# Patient Record
Sex: Female | Born: 1985 | Race: White | Hispanic: No | Marital: Married | Smoking: Never smoker
Health system: Southern US, Community
[De-identification: ages and names within clinical notes are randomized; demographics above are authoritative.]

## PROBLEM LIST (undated history)

## (undated) DIAGNOSIS — Z789 Other specified health status: Secondary | ICD-10-CM

## (undated) HISTORY — PX: TYMPANOSTOMY TUBE PLACEMENT: SHX32

## (undated) HISTORY — PX: WISDOM TOOTH EXTRACTION: SHX21

---

## 2018-03-22 NOTE — L&D Delivery Note (Signed)
Delivery Note:  G1P0 at [redacted]w[redacted]d  Admitting diagnosis: IOL for Gestational HTN Risks: Gestational HTN, Obesity Onset of labor: 12/08/2018 @ 1300 Augmentation: AROM, Pitocin, Cytotec and Foley Balloon ROM: 1252  Complete dilation at 12/08/2018  @ 1952 Onset of pushing at 1954 FHR second stage Cat 2 - Repetitive deep variables with late recovery. Patient pushed with every other ctx in lateral positions. Moderate variability throughout.  Analgesia /Anesthesia intrapartum:Epidural  Pushing in right and left lateral  Spouse present for birth and supportive  Delivery of a Live born female  Birth Weight:   APGAR: 71, 9  Newborn Delivery   Birth date/time: 12/08/2018 20:40:00 Delivery type: Vaginal, Spontaneous      in vertex presentation, position OA to ROT.  Nuchal Cord: Yes - baby somersaulted onto perineum for Nuchal x1 Cord double clamped after cessation of pulsation, cut by FOB.  Collection of cord blood for typing completed. Cord blood donation-None  Arterial cord blood sample-  None  Placenta delivered-Spontaneous  with  gentle traction, LUS massage and maternal effort. Small placental plate with eccentric cord insertion, complete and intact. Uterotonics: Pitocin bolus post placenta Placenta to L&D for disposal. Uterine tone firm, bleeding light.  1st degree  laceration and right labial splay identified.  Episiotomy:None  Local analgesia: None  Repair: 3-0 Vicryl Rapide, 4-0 Vicryl. Perineal repair with standard fashion and good hemostasis.  Est. Blood Loss (mL): 212 Complications: None  APGAR:1 min-8 , 5 min-9  10 min-   Mom to postpartum.  Baby to Couplet care / Skin to Skin.  Repeat PEC labs in AM.  Delivery Report:  Review the Delivery Report for details.     Signed: Juanna Cao, SNM, BSN 12/08/2018, 9:25 PM

## 2018-07-11 LAB — OB RESULTS CONSOLE HEPATITIS B SURFACE ANTIGEN: Hepatitis B Surface Ag: NEGATIVE

## 2018-07-11 LAB — OB RESULTS CONSOLE RUBELLA ANTIBODY, IGM: Rubella: IMMUNE

## 2018-07-11 LAB — OB RESULTS CONSOLE HIV ANTIBODY (ROUTINE TESTING): HIV: NONREACTIVE

## 2018-07-14 LAB — OB RESULTS CONSOLE GC/CHLAMYDIA
Chlamydia: NEGATIVE
Gonorrhea: NEGATIVE

## 2018-09-30 ENCOUNTER — Inpatient Hospital Stay (HOSPITAL_COMMUNITY)
Admission: AD | Admit: 2018-09-30 | Discharge: 2018-10-01 | Disposition: A | Discharge status: Home / Self Care | Payer: 59 | Attending: Obstetrics and Gynecology | Admitting: Obstetrics and Gynecology

## 2018-09-30 ENCOUNTER — Encounter (HOSPITAL_COMMUNITY): Payer: Self-pay

## 2018-09-30 ENCOUNTER — Inpatient Hospital Stay (HOSPITAL_COMMUNITY): Payer: 59

## 2018-09-30 DIAGNOSIS — R109 Unspecified abdominal pain: Secondary | ICD-10-CM

## 2018-09-30 DIAGNOSIS — N200 Calculus of kidney: Secondary | ICD-10-CM | POA: Insufficient documentation

## 2018-09-30 DIAGNOSIS — O26892 Other specified pregnancy related conditions, second trimester: Secondary | ICD-10-CM | POA: Diagnosis not present

## 2018-09-30 DIAGNOSIS — Z3A27 27 weeks gestation of pregnancy: Secondary | ICD-10-CM

## 2018-09-30 DIAGNOSIS — O26833 Pregnancy related renal disease, third trimester: Secondary | ICD-10-CM

## 2018-09-30 DIAGNOSIS — M549 Dorsalgia, unspecified: Secondary | ICD-10-CM | POA: Diagnosis present

## 2018-09-30 DIAGNOSIS — O26832 Pregnancy related renal disease, second trimester: Secondary | ICD-10-CM | POA: Insufficient documentation

## 2018-09-30 HISTORY — DX: Other specified health status: Z78.9

## 2018-09-30 LAB — CBC WITH DIFFERENTIAL/PLATELET
Abs Immature Granulocytes: 0.12 10*3/uL — ABNORMAL HIGH (ref 0.00–0.07)
Basophils Absolute: 0 10*3/uL (ref 0.0–0.1)
Basophils Relative: 0 %
Eosinophils Absolute: 0 10*3/uL (ref 0.0–0.5)
Eosinophils Relative: 0 %
HCT: 38.5 % (ref 36.0–46.0)
Hemoglobin: 13.1 g/dL (ref 12.0–15.0)
Immature Granulocytes: 1 %
Lymphocytes Relative: 11 %
Lymphs Abs: 1.8 10*3/uL (ref 0.7–4.0)
MCH: 31 pg (ref 26.0–34.0)
MCHC: 34 g/dL (ref 30.0–36.0)
MCV: 91.2 fL (ref 80.0–100.0)
Monocytes Absolute: 0.9 10*3/uL (ref 0.1–1.0)
Monocytes Relative: 5 %
Neutro Abs: 13.6 10*3/uL — ABNORMAL HIGH (ref 1.7–7.7)
Neutrophils Relative %: 83 %
Platelets: 262 10*3/uL (ref 150–400)
RBC: 4.22 MIL/uL (ref 3.87–5.11)
RDW: 12 % (ref 11.5–15.5)
WBC: 16.4 10*3/uL — ABNORMAL HIGH (ref 4.0–10.5)
nRBC: 0 % (ref 0.0–0.2)

## 2018-09-30 LAB — COMPREHENSIVE METABOLIC PANEL
ALT: 14 U/L (ref 0–44)
AST: 33 U/L (ref 15–41)
Albumin: 3.2 g/dL — ABNORMAL LOW (ref 3.5–5.0)
Alkaline Phosphatase: 50 U/L (ref 38–126)
Anion gap: 13 (ref 5–15)
BUN: 9 mg/dL (ref 6–20)
CO2: 17 mmol/L — ABNORMAL LOW (ref 22–32)
Calcium: 9.1 mg/dL (ref 8.9–10.3)
Chloride: 104 mmol/L (ref 98–111)
Creatinine, Ser: 0.94 mg/dL (ref 0.44–1.00)
GFR calc Af Amer: >60 mL/min (ref >60)
GFR calc non Af Amer: >60 mL/min (ref >60)
Glucose, Bld: 106 mg/dL — ABNORMAL HIGH (ref 70–99)
Potassium: 4.5 mmol/L (ref 3.5–5.1)
Sodium: 134 mmol/L — ABNORMAL LOW (ref 135–145)
Total Bilirubin: 1.1 mg/dL (ref 0.3–1.2)
Total Protein: 6 g/dL — ABNORMAL LOW (ref 6.5–8.1)

## 2018-09-30 MED ORDER — LACTATED RINGERS IV BOLUS
1000.0000 mL | Freq: Once | INTRAVENOUS | Status: AC
  Administered 2018-09-30: 1000 mL via INTRAVENOUS

## 2018-09-30 MED ORDER — HYDROMORPHONE HCL 1 MG/ML IJ SOLN
1.0000 mg | Freq: Once | INTRAMUSCULAR | Status: AC
  Administered 2018-09-30: 1 mg via INTRAVENOUS
  Filled 2018-09-30: qty 1

## 2018-09-30 MED ORDER — ONDANSETRON HCL 4 MG/2ML IJ SOLN
4.0000 mg | Freq: Once | INTRAMUSCULAR | Status: AC
  Administered 2018-09-30: 4 mg via INTRAVENOUS
  Filled 2018-09-30: qty 2

## 2018-09-30 NOTE — MAU Provider Note (Signed)
History     CSN: [id number]  Arrival date and time: 09/30/18 2102   First Provider Initiated Contact with Patient 09/30/18 2132      Chief Complaint  Patient presents with  . Back Pain   Christine Rivas is a 33 y.o. G1P0 at 3015w2d who presents today with back pain. She was seen by her OB on 09/29/2018 and was diagnosed with a UTI. She was started on an antibiotic, but around 0200 today she had severe back pain. She has been vomiting due to the pain. She denies any contractions, VB or  LOF. She reports normal fetal movement. Next OB visit: 10/16/2018  Back Pain This is a new problem. The current episode started today. The problem occurs constantly. The problem has been gradually worsening since onset. Pain location: Right Flank  The pain is at a severity of 10/10. Pertinent negatives include no dysuria or fever. Risk factors include pregnancy. Treatments tried: tylenol PM around 0200. The treatment provided no relief.    OB History    Gravida  1   Para      Term      Preterm      AB      Living        SAB      TAB      Ectopic      Multiple      Live Births              Past Medical History:  Diagnosis Date  . Medical history non-contributory     Past Surgical History:  Procedure Laterality Date  . WISDOM TOOTH EXTRACTION      Family History  Problem Relation Age of Onset  . Hypertension Father     Social History   Tobacco Use  . Smoking status: Never Smoker  . Smokeless tobacco: Never Used  Substance Use Topics  . Alcohol use: Not Currently  . Drug use: Not Currently    Allergies: No Known Allergies  Medications Prior to Admission  Medication Sig Dispense Refill Last Dose  . Multiple Vitamin (MULTIVITAMIN) tablet Take 1 tablet by mouth daily.     . nitrofurantoin, macrocrystal-monohydrate, (MACROBID) 100 MG capsule Take 100 mg by mouth 2 (two) times daily.       Review of Systems  Constitutional: Negative for diaphoresis and fever.   Gastrointestinal: Positive for nausea and vomiting.  Genitourinary: Positive for flank pain. Negative for dysuria, hematuria, vaginal bleeding and vaginal discharge.  Musculoskeletal: Positive for back pain.   Physical Exam   Blood pressure (!) 123/96, pulse 96, temperature 98.3 F (36.8 C), resp. rate 20, weight 88.5 kg, SpO2 100 %.  Physical Exam  Nursing note and vitals reviewed. Constitutional: She is oriented to person, place, and time. She appears well-developed and well-nourished. No distress.  HENT:  Head: Normocephalic.  Cardiovascular: Normal rate.  Respiratory: Effort normal.  GI: Soft. There is no abdominal tenderness. There is no rebound.  Neurological: She is alert and oriented to person, place, and time.  Skin: Skin is warm and dry.  Psychiatric: She has a normal mood and affect.     NST:  Baseline: 135 Variability: moderate Accels: 10x10 Decels: none Toco: none Appropriate for GA  Results for orders placed or performed during the hospital encounter of 09/30/18 (from the past 24 hour(s))  CBC with Differential/Platelet     Status: Abnormal   Collection Time: 09/30/18 10:15 PM  Result Value Ref Range   WBC 16.4 (  H) 4.0 - 10.5 K/uL   RBC 4.22 3.87 - 5.11 MIL/uL   Hemoglobin 13.1 12.0 - 15.0 g/dL   HCT 38.5 36.0 - 46.0 %   MCV 91.2 80.0 - 100.0 fL   MCH 31.0 26.0 - 34.0 pg   MCHC 34.0 30.0 - 36.0 g/dL   RDW 12.0 11.5 - 15.5 %   Platelets 262 150 - 400 K/uL   nRBC 0.0 0.0 - 0.2 %   Neutrophils Relative % 83 %   Neutro Abs 13.6 (H) 1.7 - 7.7 K/uL   Lymphocytes Relative 11 %   Lymphs Abs 1.8 0.7 - 4.0 K/uL   Monocytes Relative 5 %   Monocytes Absolute 0.9 0.1 - 1.0 K/uL   Eosinophils Relative 0 %   Eosinophils Absolute 0.0 0.0 - 0.5 K/uL   Basophils Relative 0 %   Basophils Absolute 0.0 0.0 - 0.1 K/uL   Immature Granulocytes 1 %   Abs Immature Granulocytes 0.12 (H) 0.00 - 0.07 K/uL  Comprehensive metabolic panel     Status: Abnormal   Collection  Time: 09/30/18 10:15 PM  Result Value Ref Range   Sodium 134 (L) 135 - 145 mmol/L   Potassium 4.5 3.5 - 5.1 mmol/L   Chloride 104 98 - 111 mmol/L   CO2 17 (L) 22 - 32 mmol/L   Glucose, Bld 106 (H) 70 - 99 mg/dL   BUN 9 6 - 20 mg/dL   Creatinine, Ser 0.94 0.44 - 1.00 mg/dL   Calcium 9.1 8.9 - 10.3 mg/dL   Total Protein 6.0 (L) 6.5 - 8.1 g/dL   Albumin 3.2 (L) 3.5 - 5.0 g/dL   AST 33 15 - 41 U/L   ALT 14 0 - 44 U/L   Alkaline Phosphatase 50 38 - 126 U/L   Total Bilirubin 1.1 0.3 - 1.2 mg/dL   GFR calc non Af Amer >60 >60 mL/min   GFR calc Af Amer >60 >60 mL/min   Anion gap 13 5 - 15   US Renal  Result Date: 09/30/2018 CLINICAL DATA:  Initial evaluation for acute right flank pain, [redacted] weeks pregnant. EXAM: RENAL / URINARY TRACT ULTRASOUND COMPLETE COMPARISON:  None. FINDINGS: Right Kidney: Renal measurements: 11.4 x 5.6 x 6.6 cm = volume: 222.9 mL. Echogenicity within normal limits. 4 mm nonobstructive stone present at the lower pole. Moderate right hydronephrosis, which persisted postvoid. No discrete renal lesion. Left Kidney: Renal measurements: 11.2 x 5.0 x 5.5 cm = volume: 160.1 mL. Echogenicity within normal limits. No discrete renal lesion. 5 mm nonobstructive calculus at the lower pole. No hydronephrosis. Bladder: Appears normal for degree of bladder distention. A left ureteral jet was visualized. No right ureteral jet visualized. IMPRESSION: 1. Moderate right hydronephrosis. 2. Bilateral nonobstructive nephrolithiasis as above. Electronically Signed   By: Jeannine Boga M.D.   On: 09/30/2018 23:51     MAU Course  Procedures  MDM Patient has had IV pain meds and fluids. She reports that her pain has improved.  [place] confirms kidney stones   Assessment and Plan   1. Kidney stone complicating pregnancy, third trimester   2. Right flank pain   3. [redacted] weeks gestation of pregnancy    DC home Comfort measures reviewed  3rd Trimester precautions  PTL precautions  Fetal kick  counts RX: flomax 0.4mg  QD, Zofran PRN and dilaudid PRN, continue abx already prescribed  Return to MAU as needed FU with OB as planned  Follow-up Information    Brien Few, MD Follow  up.   Specialty: Obstetrics and Gynecology Contact information: [address] [place] [address] [phone]          Thressa ShellerHeather Hogan DNP, CNM  10/01/18  12:45 AM

## 2018-09-30 NOTE — MAU Note (Addendum)
Reports she is still being treated for a UTI-at 0200 started having severe right flank pain, thinks it's a kidney stone-no hx.  No VB.  No CTX.  + FM.  Took a tylenol PM last night with no relief.

## 2018-09-30 NOTE — MAU Note (Signed)
Patient states she's unable to void at this time.  CNM notified.

## 2018-10-01 DIAGNOSIS — O26832 Pregnancy related renal disease, second trimester: Secondary | ICD-10-CM | POA: Diagnosis not present

## 2018-10-01 DIAGNOSIS — O26892 Other specified pregnancy related conditions, second trimester: Secondary | ICD-10-CM

## 2018-10-01 DIAGNOSIS — R109 Unspecified abdominal pain: Secondary | ICD-10-CM

## 2018-10-01 DIAGNOSIS — N200 Calculus of kidney: Secondary | ICD-10-CM

## 2018-10-01 DIAGNOSIS — Z3A27 27 weeks gestation of pregnancy: Secondary | ICD-10-CM

## 2018-10-01 LAB — URINALYSIS, ROUTINE W REFLEX MICROSCOPIC
Bacteria, UA: NONE SEEN
Bilirubin Urine: NEGATIVE
Glucose, UA: NEGATIVE mg/dL
Hgb urine dipstick: NEGATIVE
Ketones, ur: 80 mg/dL — AB
Nitrite: NEGATIVE
Protein, ur: NEGATIVE mg/dL
Specific Gravity, Urine: 1.02 (ref 1.005–1.030)
pH: 6 (ref 5.0–8.0)

## 2018-10-01 MED ORDER — TAMSULOSIN HCL 0.4 MG PO CAPS: 0.4000 mg | ORAL_CAPSULE | Freq: Every day | ORAL | 0 refills | Status: DC

## 2018-10-01 MED ORDER — HYDROMORPHONE HCL 2 MG PO TABS: 2.0000 mg | ORAL_TABLET | Freq: Four times a day (QID) | ORAL | 0 refills | Status: DC | PRN

## 2018-10-01 MED ORDER — HYDROMORPHONE HCL 1 MG/ML IJ SOLN
1.0000 mg | Freq: Once | INTRAMUSCULAR | Status: AC
  Administered 2018-10-01: 1 mg via INTRAVENOUS
  Filled 2018-10-01: qty 1

## 2018-10-01 MED ORDER — ONDANSETRON 8 MG PO TBDP: 8.0000 mg | ORAL_TABLET | Freq: Three times a day (TID) | ORAL | 0 refills | Status: DC | PRN

## 2018-10-01 NOTE — Discharge Instructions (Signed)

## 2018-10-02 LAB — CULTURE, OB URINE: Culture: NO GROWTH

## 2018-10-16 LAB — OB RESULTS CONSOLE RPR: RPR: NONREACTIVE

## 2018-11-30 LAB — OB RESULTS CONSOLE GBS: GBS: NEGATIVE

## 2018-12-07 ENCOUNTER — Inpatient Hospital Stay (HOSPITAL_COMMUNITY)
Admission: AD | Admit: 2018-12-07 | Discharge: 2018-12-10 | DRG: 807 | Disposition: A | Discharge status: Home / Self Care | Payer: 59 | Attending: Obstetrics and Gynecology | Admitting: Obstetrics and Gynecology

## 2018-12-07 ENCOUNTER — Encounter (HOSPITAL_COMMUNITY): Payer: Self-pay | Admitting: *Deleted

## 2018-12-07 ENCOUNTER — Other Ambulatory Visit: Payer: Self-pay

## 2018-12-07 DIAGNOSIS — O2243 Hemorrhoids in pregnancy, third trimester: Secondary | ICD-10-CM | POA: Diagnosis present

## 2018-12-07 DIAGNOSIS — O99214 Obesity complicating childbirth: Secondary | ICD-10-CM | POA: Diagnosis present

## 2018-12-07 DIAGNOSIS — Z3A37 37 weeks gestation of pregnancy: Secondary | ICD-10-CM

## 2018-12-07 DIAGNOSIS — Z20828 Contact with and (suspected) exposure to other viral communicable diseases: Secondary | ICD-10-CM | POA: Diagnosis present

## 2018-12-07 DIAGNOSIS — Z23 Encounter for immunization: Secondary | ICD-10-CM | POA: Diagnosis not present

## 2018-12-07 DIAGNOSIS — O43123 Velamentous insertion of umbilical cord, third trimester: Secondary | ICD-10-CM | POA: Diagnosis present

## 2018-12-07 DIAGNOSIS — Z3490 Encounter for supervision of normal pregnancy, unspecified, unspecified trimester: Secondary | ICD-10-CM | POA: Diagnosis present

## 2018-12-07 DIAGNOSIS — O139 Gestational [pregnancy-induced] hypertension without significant proteinuria, unspecified trimester: Secondary | ICD-10-CM | POA: Diagnosis present

## 2018-12-07 DIAGNOSIS — O134 Gestational [pregnancy-induced] hypertension without significant proteinuria, complicating childbirth: Secondary | ICD-10-CM | POA: Diagnosis present

## 2018-12-07 LAB — CBC
HCT: 35 % — ABNORMAL LOW (ref 36.0–46.0)
Hemoglobin: 12.5 g/dL (ref 12.0–15.0)
MCH: 32.3 pg (ref 26.0–34.0)
MCHC: 35.7 g/dL (ref 30.0–36.0)
MCV: 90.4 fL (ref 80.0–100.0)
Platelets: 231 10*3/uL (ref 150–400)
RBC: 3.87 MIL/uL (ref 3.87–5.11)
RDW: 12.7 % (ref 11.5–15.5)
WBC: 10.5 10*3/uL (ref 4.0–10.5)
nRBC: 0 % (ref 0.0–0.2)

## 2018-12-07 LAB — COMPREHENSIVE METABOLIC PANEL
ALT: 15 U/L (ref 0–44)
AST: 19 U/L (ref 15–41)
Albumin: 2.7 g/dL — ABNORMAL LOW (ref 3.5–5.0)
Alkaline Phosphatase: 61 U/L (ref 38–126)
Anion gap: 11 (ref 5–15)
BUN: 10 mg/dL (ref 6–20)
CO2: 18 mmol/L — ABNORMAL LOW (ref 22–32)
Calcium: 8.9 mg/dL (ref 8.9–10.3)
Chloride: 103 mmol/L (ref 98–111)
Creatinine, Ser: 0.56 mg/dL (ref 0.44–1.00)
GFR calc Af Amer: >60 mL/min (ref >60)
GFR calc non Af Amer: >60 mL/min (ref >60)
Glucose, Bld: 85 mg/dL (ref 70–99)
Potassium: 4.2 mmol/L (ref 3.5–5.1)
Sodium: 132 mmol/L — ABNORMAL LOW (ref 135–145)
Total Bilirubin: 0.3 mg/dL (ref 0.3–1.2)
Total Protein: 5.5 g/dL — ABNORMAL LOW (ref 6.5–8.1)

## 2018-12-07 LAB — TYPE AND SCREEN
ABO/RH(D): A POS
Antibody Screen: NEGATIVE

## 2018-12-07 LAB — PROTEIN / CREATININE RATIO, URINE
Creatinine, Urine: 51.27 mg/dL
Protein Creatinine Ratio: 0.14 mg/mg{Cre} (ref 0.00–0.15)
Total Protein, Urine: 7 mg/dL

## 2018-12-07 LAB — ABO/RH: ABO/RH(D): A POS

## 2018-12-07 LAB — SARS CORONAVIRUS 2 BY RT PCR (HOSPITAL ORDER, PERFORMED IN ~~LOC~~ HOSPITAL LAB): SARS Coronavirus 2: NEGATIVE

## 2018-12-07 LAB — URIC ACID: Uric Acid, Serum: 4.4 mg/dL (ref 2.5–7.1)

## 2018-12-07 MED ORDER — MISOPROSTOL 25 MCG QUARTER TABLET
25.0000 ug | ORAL_TABLET | ORAL | Status: DC | PRN
  Administered 2018-12-07 (×3): 25 ug via VAGINAL
  Filled 2018-12-07 (×3): qty 1

## 2018-12-07 MED ORDER — LACTATED RINGERS IV SOLN
INTRAVENOUS | Status: DC
  Administered 2018-12-07 – 2018-12-08 (×4): via INTRAVENOUS

## 2018-12-07 MED ORDER — EPHEDRINE 5 MG/ML INJ: 10.0000 mg | INTRAVENOUS | Status: DC | PRN

## 2018-12-07 MED ORDER — ACETAMINOPHEN 325 MG PO TABS: 650.0000 mg | ORAL_TABLET | ORAL | Status: DC | PRN

## 2018-12-07 MED ORDER — LACTATED RINGERS IV SOLN: 500.0000 mL | INTRAVENOUS | Status: DC | PRN

## 2018-12-07 MED ORDER — PHENYLEPHRINE 40 MCG/ML (10ML) SYRINGE FOR IV PUSH (FOR BLOOD PRESSURE SUPPORT)
80.0000 ug | PREFILLED_SYRINGE | INTRAVENOUS | Status: DC | PRN
  Filled 2018-12-07: qty 10

## 2018-12-07 MED ORDER — FENTANYL-BUPIVACAINE-NACL 0.5-0.125-0.9 MG/250ML-% EP SOLN
12.0000 mL/h | EPIDURAL | Status: DC | PRN
  Filled 2018-12-07: qty 250

## 2018-12-07 MED ORDER — OXYTOCIN 40 UNITS IN NORMAL SALINE INFUSION - SIMPLE MED
2.5000 [IU]/h | INTRAVENOUS | Status: DC
  Administered 2018-12-08: 21:00:00 2.5 [IU]/h via INTRAVENOUS

## 2018-12-07 MED ORDER — OXYTOCIN 40 UNITS IN NORMAL SALINE INFUSION - SIMPLE MED
1.0000 m[IU]/min | INTRAVENOUS | Status: DC
  Administered 2018-12-07: 2 m[IU]/min via INTRAVENOUS
  Filled 2018-12-07: qty 1000

## 2018-12-07 MED ORDER — FENTANYL CITRATE (PF) 100 MCG/2ML IJ SOLN
100.0000 ug | INTRAMUSCULAR | Status: DC | PRN
  Administered 2018-12-08: 100 ug via INTRAVENOUS
  Filled 2018-12-07: qty 2

## 2018-12-07 MED ORDER — LIDOCAINE HCL (PF) 1 % IJ SOLN: 30.0000 mL | INTRAMUSCULAR | Status: DC | PRN

## 2018-12-07 MED ORDER — OXYCODONE-ACETAMINOPHEN 5-325 MG PO TABS: 2.0000 | ORAL_TABLET | ORAL | Status: DC | PRN

## 2018-12-07 MED ORDER — DIPHENHYDRAMINE HCL 50 MG/ML IJ SOLN: 12.5000 mg | INTRAMUSCULAR | Status: DC | PRN

## 2018-12-07 MED ORDER — ONDANSETRON HCL 4 MG/2ML IJ SOLN
4.0000 mg | Freq: Four times a day (QID) | INTRAMUSCULAR | Status: DC | PRN
  Administered 2018-12-08 (×2): 4 mg via INTRAVENOUS
  Filled 2018-12-07 (×2): qty 2

## 2018-12-07 MED ORDER — TERBUTALINE SULFATE 1 MG/ML IJ SOLN: 0.2500 mg | Freq: Once | INTRAMUSCULAR | Status: DC | PRN

## 2018-12-07 MED ORDER — PHENYLEPHRINE 40 MCG/ML (10ML) SYRINGE FOR IV PUSH (FOR BLOOD PRESSURE SUPPORT): 80.0000 ug | PREFILLED_SYRINGE | INTRAVENOUS | Status: DC | PRN

## 2018-12-07 MED ORDER — OXYCODONE-ACETAMINOPHEN 5-325 MG PO TABS: 1.0000 | ORAL_TABLET | ORAL | Status: DC | PRN

## 2018-12-07 MED ORDER — LACTATED RINGERS IV SOLN
500.0000 mL | Freq: Once | INTRAVENOUS | Status: AC
  Administered 2018-12-08: 500 mL via INTRAVENOUS

## 2018-12-07 MED ORDER — SOD CITRATE-CITRIC ACID 500-334 MG/5ML PO SOLN: 30.0000 mL | ORAL | Status: DC | PRN

## 2018-12-07 MED ORDER — OXYTOCIN BOLUS FROM INFUSION
500.0000 mL | Freq: Once | INTRAVENOUS | Status: AC
  Administered 2018-12-08: 500 mL/h via INTRAVENOUS

## 2018-12-07 NOTE — H&P (Signed)
Christine Rivas is a 33 y.o. female presenting for IOL for Gestational HTN. G1 at 37 wks, dated by 11 wk sono, [facility] 12/28/18  BP elevated on 9/15 in 130/90s range in office, 1st abn pressures this pregnancy, so PIH labs done and she was asked to come to office for repeat BP at 48hrs. PIH labs nl, incl nl Urine p/c at 0.15. BP today 148/98. 138/96, so sent in for IOL. Denies HA/ vision changes/ RUQ pain. Swelling +3, wt gain 10 lbs in 2 weeks  Pregnancy complicated by baby measuring 1 week off at anatomy sono (dated by 11 wk sono), repeat sono at 26 wks noted baby at 33 % and AC 14% but improved at 33 wks at 30% and AC 24% Renal stones with pain in 3rd trim with hydronephrosis, seeing Urologist . OB History    Gravida  1   Para      Term      Preterm      AB      Living        SAB      TAB      Ectopic      Multiple      Live Births             Past Medical History:  Diagnosis Date  . Medical history non-contributory    Past Surgical History:  Procedure Laterality Date  . TYMPANOSTOMY TUBE PLACEMENT     as an infant  . WISDOM TOOTH EXTRACTION     Family History: family history includes Cleft lip in her father; Cleft palate in her father; Glaucoma in her father; Hypertension in her father; Retinitis pigmentosa in her father. Social History:  reports that she has never smoked. She has never used smokeless tobacco. She reports previous alcohol use. She reports previous drug use.     Maternal Diabetes: No Genetic Screening: Normal QUAD Maternal Ultrasounds/Referrals: Normal and Other: measuring smaller since anatomy sono but no IUGR Fetal Ultrasounds or other Referrals:  None Maternal Substance Abuse:  No Significant Maternal Medications:  None Significant Maternal Lab Results:  Group B Strep negative Other Comments:  Gestational HTN  ROS neg  History Dilation: Fingertip Effacement (%): Thick Station: Ballotable Exam by:: Ignacia Felling, RN Blood pressure (!)  129/96, pulse 87, temperature 98.4 F (36.9 C), temperature source Oral, resp. rate 16, height 4\' 11"  (1.499 m), weight 100.7 kg. Exam Physical Exam  A&O x 3, no acute distress. Pleasant HEENT neg, no thyromegaly Lungs CTA bilat CV RRR, S1S2 normal Abdo soft, non tender, non acute Extr no edema/ tenderness Pelvic f.tip/ thick/ -4/ Vx  FHT  140s + accels no decels mod variab- cat I Toco none   Prenatal labs: ABO, Rh: --/--/A POS (09/17 1012) Antibody: NEG (09/17 1012) Rubella:  Immune RPR:   NR HBsAg:   Neg HIV:   Neg GBS:   Neg   Assessment/Plan: 33 yo G1 at 37 wks IOL for GHTN. FHT cat I. GBS(-) PIH labs nl 9/15, repeat today. Watch BP closed Cytotec 1-2 doses as needed Ambulate, labor diet until active labor or pitocin, then clears Pain mngmt reviewed.  EFW 6.1/2 lbs, anticipate SVD     Elveria Royals 12/07/2018, 1:11 PM

## 2018-12-07 NOTE — Progress Notes (Signed)
Dr Benjie Karvonen at bedside for assessment.  Saline lock IV.  May eat regular diet while on cytotec.

## 2018-12-07 NOTE — Progress Notes (Signed)
Hospital day 1.  Induction of labor for gestational hypertension  Subjective: Patient reports no headache, no vision change, no right upper quadrant pain.  Good fetal movement, no leakage of fluid, no vaginal bleeding.  Patient notes some crampiness since her Cytotec.  Patient received for Cytotec at 10:30 AM.  Second Cytotec at 2:30 PM  Objective: Vitals with BMI 12/07/2018 12/07/2018 12/07/2018  Height - - -  Weight - - -  BMI - - -  Systolic 811 886 773  Diastolic 736 97 89  Pulse 94 100 94   General: Well-appearing, no distress Abdomen: Gravid, no fundal tenderness, no right upper quadrant pain GU: Deferred.  Fingertip per nursing notes Lower extremity: 2+ pitting edema, 2+ DTR  Toco: Irregular FH: 150s, positive accelerations, no decelerations, 10 beat variability  Assessment plan: 33 year old G1 at 37 weeks and 0 days with gestational hypertension for induction of labor.  No evidence of preeclampsia.  Cervical ripening.  Now on second Cytotec.  Will likely need a third Cytotec then removed Pitocin.  -Reactive fetal testing  Ala Dach 12/07/2018 4:51 PM

## 2018-12-08 ENCOUNTER — Inpatient Hospital Stay (HOSPITAL_COMMUNITY): Payer: 59 | Admitting: Anesthesiology

## 2018-12-08 DIAGNOSIS — O139 Gestational [pregnancy-induced] hypertension without significant proteinuria, unspecified trimester: Secondary | ICD-10-CM | POA: Diagnosis present

## 2018-12-08 LAB — CBC
HCT: 36.3 % (ref 36.0–46.0)
Hemoglobin: 12.7 g/dL (ref 12.0–15.0)
MCH: 31.8 pg (ref 26.0–34.0)
MCHC: 35 g/dL (ref 30.0–36.0)
MCV: 91 fL (ref 80.0–100.0)
Platelets: 212 10*3/uL (ref 150–400)
RBC: 3.99 MIL/uL (ref 3.87–5.11)
RDW: 12.7 % (ref 11.5–15.5)
WBC: 14.1 10*3/uL — ABNORMAL HIGH (ref 4.0–10.5)
nRBC: 0 % (ref 0.0–0.2)

## 2018-12-08 LAB — RPR: RPR Ser Ql: NONREACTIVE

## 2018-12-08 MED ORDER — IBUPROFEN 600 MG PO TABS
600.0000 mg | ORAL_TABLET | Freq: Four times a day (QID) | ORAL | Status: DC
  Administered 2018-12-09 – 2018-12-10 (×6): 600 mg via ORAL
  Filled 2018-12-08 (×7): qty 1

## 2018-12-08 MED ORDER — SODIUM CHLORIDE (PF) 0.9 % IJ SOLN
INTRAMUSCULAR | Status: DC | PRN
  Administered 2018-12-08: 12 mL/h via EPIDURAL

## 2018-12-08 MED ORDER — ONDANSETRON HCL 4 MG/2ML IJ SOLN: 4.0000 mg | INTRAMUSCULAR | Status: DC | PRN

## 2018-12-08 MED ORDER — INFLUENZA VAC SPLIT QUAD 0.5 ML IM SUSY
0.5000 mL | PREFILLED_SYRINGE | INTRAMUSCULAR | Status: AC
  Administered 2018-12-10: 10:00:00 0.5 mL via INTRAMUSCULAR
  Filled 2018-12-08: qty 0.5

## 2018-12-08 MED ORDER — TETANUS-DIPHTH-ACELL PERTUSSIS 5-2.5-18.5 LF-MCG/0.5 IM SUSP: 0.5000 mL | Freq: Once | INTRAMUSCULAR | Status: DC

## 2018-12-08 MED ORDER — WITCH HAZEL-GLYCERIN EX PADS: 1.0000 "application " | MEDICATED_PAD | CUTANEOUS | Status: DC | PRN

## 2018-12-08 MED ORDER — FAMOTIDINE 20 MG PO TABS
20.0000 mg | ORAL_TABLET | Freq: Two times a day (BID) | ORAL | Status: DC
  Filled 2018-12-08: qty 1

## 2018-12-08 MED ORDER — SENNOSIDES-DOCUSATE SODIUM 8.6-50 MG PO TABS
2.0000 | ORAL_TABLET | ORAL | Status: DC
  Administered 2018-12-09 (×2): 2 via ORAL
  Filled 2018-12-08 (×2): qty 2

## 2018-12-08 MED ORDER — COCONUT OIL OIL: 1.0000 "application " | TOPICAL_OIL | Status: DC | PRN

## 2018-12-08 MED ORDER — LIDOCAINE HCL (PF) 1 % IJ SOLN
INTRAMUSCULAR | Status: DC | PRN
  Administered 2018-12-08 (×2): 5 mL via EPIDURAL

## 2018-12-08 MED ORDER — PRENATAL MULTIVITAMIN CH
1.0000 | ORAL_TABLET | Freq: Every day | ORAL | Status: DC
  Administered 2018-12-09: 1 via ORAL
  Filled 2018-12-08 (×2): qty 1

## 2018-12-08 MED ORDER — ONDANSETRON HCL 4 MG PO TABS: 4.0000 mg | ORAL_TABLET | ORAL | Status: DC | PRN

## 2018-12-08 MED ORDER — BENZOCAINE-MENTHOL 20-0.5 % EX AERO
1.0000 "application " | INHALATION_SPRAY | CUTANEOUS | Status: DC | PRN
  Administered 2018-12-09: 1 via TOPICAL
  Filled 2018-12-08: qty 56

## 2018-12-08 MED ORDER — ACETAMINOPHEN 325 MG PO TABS: 650.0000 mg | ORAL_TABLET | ORAL | Status: DC | PRN

## 2018-12-08 MED ORDER — DIBUCAINE (PERIANAL) 1 % EX OINT: 1.0000 "application " | TOPICAL_OINTMENT | CUTANEOUS | Status: DC | PRN

## 2018-12-08 MED ORDER — DIPHENHYDRAMINE HCL 25 MG PO CAPS: 25.0000 mg | ORAL_CAPSULE | Freq: Four times a day (QID) | ORAL | Status: DC | PRN

## 2018-12-08 MED ORDER — SIMETHICONE 80 MG PO CHEW: 80.0000 mg | CHEWABLE_TABLET | ORAL | Status: DC | PRN

## 2018-12-08 NOTE — Progress Notes (Signed)
  S: Doing well, no complaints, pain increasing and waiting on an epidural (need repeat CBC for gest htn). No LOF, pt reports no HA, no vision change, no RUQ pain, just feeling contractions  O: BP (!) 145/98   Pulse 81   Temp 98.4 F (36.9 C) (Oral)   Resp 16   Ht 4\' 11"  (1.499 m)   Wt 100.7 kg   BMI 44.84 kg/m    FHT:  FHR: 140s bpm, variability: moderate,  accelerations:  Present,  decelerations:  Absent UC:   irregular, every 3-7 minutes SVE:   Dilation: 3 Effacement (%): 50 Station: -2 Exam by:: Doree Barthel, RN  Repeat exam by me now: 1.5cm/ 20%/ vtx -3/ soft/ mid  A / P:  33 y.o.  OB History  Gravida Para Term Preterm AB Living  1 0 0 0 0 0  SAB TAB Ectopic Multiple Live Births  0 0 0 0 0   at [redacted]w[redacted]d IOL for gest htn, s/p cytotec x 2, pitocin started last night. Unfortunately erroneous exam by RN overnight. Given new information now, will place cervical foley bulb and continue pitocin. Pt asks to wait until epidural placed  Gest htn. Bps still high but not dangerous, not severe, labs and exam have  Been negative for PEC.   Fetal Wellbeing:  Category I Pain Control:  Epidural  Anticipated MOD:  NSVD  Ala Dach 12/08/2018, 7:55 AM

## 2018-12-08 NOTE — Progress Notes (Signed)
Christine Rivas is a 33 y.o. G1P0 at [redacted]w[redacted]d by ultrasound admitted for IOL for Gestational HTN.  Subjective: Reports feeling well, mild cramping experienced in lower abdomen. Comfortable with epidural. Denies HA/vision changes/RUQ pain. Spouse supportive at bedside.   Objective: Vitals:   12/08/18 1301 12/08/18 1331 12/08/18 1401 12/08/18 1431  BP: (!) 138/93 (!) 142/86 (!) 114/92 (!) 136/92  Pulse: 94 89 86 74  Resp: 16 17 16 16   Temp: 98.5 F (36.9 C)     TempSrc: Axillary     SpO2:      Weight:      Height:        No intake/output data recorded. No intake/output data recorded.   FHT:  FHR: 135 bpm, variability: moderate,  accelerations:  Present,  decelerations:  Absent UC:   irritability SVE:   Dilation: 6 Effacement (%): 60 Station: -2 Exam by:: D Paul CNM  Labs:   Recent Labs    12/07/18 1012 12/08/18 0814  WBC 10.5 14.1*  HGB 12.5 12.7  HCT 35.0* 36.3  PLT 231 212    Assessment / Plan: Induction of labor due to gestational hypertension,  progressing well on pitocin  Labor: Progressing normally - Latency, still ripening. Cervical balloon expelled. IUPC flushed and better readings noted. MVUs now 140. Will continue pitocin titration. Goal for MVUs >/= 200. Placed in left lateral exaggerated Sims with peanut ball, will continue to alternate positions. SNM with CNM to follow patient for delivery with patient permission. Preeclampsia:  no signs or symptoms of toxicity and labs stable Fetal Wellbeing:  Category I Pain Control:  Epidural I/D:  GBS negative Anticipated MOD:  NSVD  Juanna Cao, SNM, BSN 12/08/2018, 3:36 PM

## 2018-12-08 NOTE — Anesthesia Preprocedure Evaluation (Signed)
Anesthesia Evaluation  Patient identified by MRN, date of birth, ID band Patient awake    Reviewed: Allergy & Precautions, H&P , NPO status , Patient's Chart, lab work & pertinent test results  History of Anesthesia Complications Negative for: history of anesthetic complications  Airway Mallampati: II  TM Distance: >3 FB Neck ROM: full    Dental no notable dental hx. (+) Teeth Intact   Pulmonary neg pulmonary ROS,    Pulmonary exam normal breath sounds clear to auscultation       Cardiovascular negative cardio ROS Normal cardiovascular exam Rhythm:regular Rate:Normal     Neuro/Psych negative neurological ROS  negative psych ROS   GI/Hepatic negative GI ROS, Neg liver ROS,   Endo/Other  Morbid obesity  Renal/GU negative Renal ROS  negative genitourinary   Musculoskeletal   Abdominal (+) + obese,   Peds  Hematology negative hematology ROS (+)   Anesthesia Other Findings   Reproductive/Obstetrics (+) Pregnancy                             Anesthesia Physical Anesthesia Plan  ASA: III  Anesthesia Plan: Epidural   Post-op Pain Management:    Induction:   PONV Risk Score and Plan:   Airway Management Planned:   Additional Equipment:   Intra-op Plan:   Post-operative Plan:   Informed Consent: I have reviewed the patients History and Physical, chart, labs and discussed the procedure including the risks, benefits and alternatives for the proposed anesthesia with the patient or authorized representative who has indicated his/her understanding and acceptance.       Plan Discussed with:   Anesthesia Plan Comments:         Anesthesia Quick Evaluation  

## 2018-12-08 NOTE — Progress Notes (Signed)
Christine Rivas is a 33 y.o. G1P0 at 7w1dby ultrasound admitted for IOL 2/2 PEC  Cervical ripening cytotec x 2 then Pitocin [facility]   Subjective: Feeling comfortable with epidural. Denies HA/NV/RUQ pain/visual changes. [facility]   Objective: Vitals:   12/08/18 0906 12/08/18 0910 12/08/18 0911 12/08/18 0933  BP: (!) 146/100  (!) 140/96 (!) 137/104  Pulse: 87  81 86  Resp: '16  16 17  ' Temp:      TempSrc:      SpO2: 100% 100%    Weight:      Height:        No intake/output data recorded. No intake/output data recorded.   FHT:  FHR: 135 bpm, variability: moderate,  accelerations:  Present,  decelerations:  Absent UC:   irritability SVE:   Dilation: 2 Effacement (%): 50 Station: -1 Exam by:: DMelina CopaCNM   Pitocin 16 mu/min  Labs:   Recent Labs    12/07/18 1012 12/08/18 0814  WBC 10.5 14.1*  HGB 12.5 12.7  HCT 35.0* 36.3  PLT 231 212   Hepatic Function Latest Ref Rng & Units 12/07/2018 09/30/2018  Total Protein 6.5 - 8.1 g/dL 5.5(L) 6.0(L)  Albumin 3.5 - 5.0 g/dL 2.7(L) 3.2(L)  AST 15 - 41 U/L 19 33  ALT 0 - 44 U/L 15 14  Alk Phosphatase 38 - 126 U/L 61 50  Total Bilirubin 0.3 - 1.2 mg/dL 0.3 1.1    Assessment / Plan: Induction of labor due to preeclampsia,  Slow cervical ripening on cytotec x 2 and now Pitocin   Labor: Cervical balloon placed, will change to Pitocin low dose 4 mu/min while mechanical ripening ongoing. Preeclampsia:  no signs or symptoms of toxicity, labs stable and BP in mild range, + edema bilateral LE's +1 Fetal Wellbeing:  Category I Pain Control:  Epidural I/D:  GBS negative Anticipated MOD:  guarded.  DJuliene Pina CNM, MSN 12/08/2018, 9:46 AM

## 2018-12-08 NOTE — Progress Notes (Signed)
Subjective: Reports nausea is beginning to resolve following admin of Zofran IV. RUQ pain still present, denies HA. Increased pelvic pressure. Lying left side exaggerated Sims. Spouse supportive at bedside.  Objective: Vitals:   12/08/18 1700 12/08/18 1730 12/08/18 1800 12/08/18 1830  BP: (!) 144/90 (!) 127/91 (!) 140/92 (!) 131/100  Pulse: 87 100 94 96  Resp: 16 16 18 18   Temp:      TempSrc:      SpO2:      Weight:      Height:        No intake/output data recorded. Total I/O In: -  Out: 1000 [Urine:1000]   FHT:  FHR: 135 bpm, variability: moderate,  accelerations:  Present,  decelerations:  Present early decelerations w/ ctx UC:   regular, every 1-2 minutes. IUPC suboptimally functioning, not tracing accurate MVUs. SVE:   Dilation: Lip/rim Effacement (%): 100 Station: -1 Exam by:: [employer]  Labs:   Recent Labs    12/07/18 1012 12/08/18 0814  WBC 10.5 14.1*  HGB 12.5 12.7  HCT 35.0* 36.3  PLT 231 212    Assessment / Plan: Induction of labor due to gestational hypertension,  progressing well on pitocin  Labor: Progressing normally - placed on right side in Flying Cowgirl position to facilitate fetal descent and rotation Preeclampsia:  no signs or symptoms of toxicity and labs stable Fetal Wellbeing:  Category I Pain Control:  Epidural I/D:  GBS neg Anticipated MOD:  NSVD  Christine Rivas, CNM, BSN 12/08/2018, 6:53 PM

## 2018-12-08 NOTE — Progress Notes (Signed)
Subjective: Nausea and RUQ resolved at this time, denies HA/vision changes. Pelvic pressure intermittent. Spouse supportive at bedside.  Objective: Vitals:   12/08/18 1730 12/08/18 1800 12/08/18 1830 12/08/18 1900  BP: (!) 127/91 (!) 140/92 (!) 131/100 (!) 124/92  Pulse: 100 94 96 89  Resp: 16 18 18 16   Temp:      TempSrc:      SpO2:      Weight:      Height:        I/O last 3 completed shifts: In: -  Out: 1000 [Urine:1000] No intake/output data recorded.   FHT:  FHR: 140 bpm, variability: moderate,  accelerations:  Present,  decelerations:  Present early decelerations w/ ctx UC:   regular, every 1-3 minutes. IUPC suboptimally functioning, not tracing accurate MVUs. SVE:   Dilation: Lip/rim Effacement (%): 100 Station: -1 Exam by:: [employer]  Labs:   Recent Labs    12/07/18 1012 12/08/18 0814  WBC 10.5 14.1*  HGB 12.5 12.7  HCT 35.0* 36.3  PLT 231 212    Assessment / Plan: Induction of labor due to gestational hypertension,  progressing well on pitocin  Labor: Progressing normally - placed in left exaggerated Sims. Will continue to alternate positions. Preeclampsia:  no signs or symptoms of toxicity and labs stable Fetal Wellbeing:  Category I Pain Control:  Epidural I/D:  GBS neg Anticipated MOD:  NSVD  Juanna Cao, CNM, BSN 12/08/2018, 7:38 PM

## 2018-12-08 NOTE — Anesthesia Procedure Notes (Signed)
Epidural Patient location during procedure: OB Start time: 12/08/2018 8:40 AM End time: 12/08/2018 8:50 AM  Staffing Anesthesiologist: Murvin Natal, MD Performed: anesthesiologist   Preanesthetic Checklist Completed: patient identified, site marked, pre-op evaluation, timeout performed, IV checked, risks and benefits discussed and monitors and equipment checked  Epidural Patient position: sitting Prep: DuraPrep Patient monitoring: heart rate, cardiac monitor, continuous pulse ox and blood pressure Approach: midline Location: L4-L5 Injection technique: LOR air  Needle:  Needle type: Tuohy  Needle gauge: 17 G Needle length: 9 cm Needle insertion depth: 8 cm Catheter type: closed end flexible Catheter size: 19 Gauge Catheter at skin depth: 13 cm Test dose: negative and Other  Assessment Events: blood not aspirated, injection not painful, no injection resistance and negative IV test  Additional Notes Informed consent obtained prior to proceeding including risk of failure, 1% risk of PDPH, risk of minor discomfort and bruising. Discussed alternatives to epidural analgesia and patient desires to proceed.  Timeout performed pre-procedure verifying patient name, procedure, and platelet count.  Patient tolerated procedure well. Reason for block:procedure for pain

## 2018-12-08 NOTE — Progress Notes (Signed)
Christine Rivas is a 33 y.o. G1P0 at [redacted]w[redacted]d by ultrasound admitted for IOL for Gestational HTN  Subjective: Began feeling nausea, RUQ pain 20 minutes ago along with pelvic pressure. Spouse supportive at bedside. RN just administered 4 mg Zofran IV for nausea. Sitting in High Fowlers position and feeling mildly uncomfortable at this time.   Objective: Vitals:   12/08/18 1630 12/08/18 1700 12/08/18 1730 12/08/18 1800  BP: (!) 155/99 (!) 144/90 (!) 127/91 (!) 140/92  Pulse: 80 87 100 94  Resp: 18 16 16    Temp:      TempSrc:      SpO2:      Weight:      Height:        No intake/output data recorded. No intake/output data recorded.   FHT:  FHR: 140 bpm, variability: moderate,  accelerations:  Present,  decelerations:  Absent UC:   regular, every 1-2 minutes. IUPC suboptimally functioning, not tracing accurate MVUs. SVE: 9.5/100/-1  Labs:   Recent Labs    12/07/18 1012 12/08/18 0814  WBC 10.5 14.1*  HGB 12.5 12.7  HCT 35.0* 36.3  PLT 231 212    Assessment / Plan: Induction of labor due to gestational hypertension,  progressing well on pitocin  Labor: Progressing normally, currently in High Fowlers position. Has been alternating from left/right exaggerated Sims to [employer]. Preeclampsia:  no signs or symptoms of toxicity and labs stable Fetal Wellbeing:  Category I Pain Control:  Epidural I/D:  GBS negative Anticipated MOD:  NSVD  Juanna Cao, CNM, BSN 12/08/2018, 6:17 PM

## 2018-12-09 ENCOUNTER — Encounter (HOSPITAL_COMMUNITY): Payer: Self-pay

## 2018-12-09 LAB — COMPREHENSIVE METABOLIC PANEL
ALT: 13 U/L (ref 0–44)
AST: 22 U/L (ref 15–41)
Albumin: 2 g/dL — ABNORMAL LOW (ref 3.5–5.0)
Alkaline Phosphatase: 56 U/L (ref 38–126)
Anion gap: 8 (ref 5–15)
BUN: 9 mg/dL (ref 6–20)
CO2: 20 mmol/L — ABNORMAL LOW (ref 22–32)
Calcium: 8.3 mg/dL — ABNORMAL LOW (ref 8.9–10.3)
Chloride: 104 mmol/L (ref 98–111)
Creatinine, Ser: 0.7 mg/dL (ref 0.44–1.00)
GFR calc Af Amer: >60 mL/min (ref >60)
GFR calc non Af Amer: >60 mL/min (ref >60)
Glucose, Bld: 83 mg/dL (ref 70–99)
Potassium: 3.7 mmol/L (ref 3.5–5.1)
Sodium: 132 mmol/L — ABNORMAL LOW (ref 135–145)
Total Bilirubin: 0.6 mg/dL (ref 0.3–1.2)
Total Protein: 4.5 g/dL — ABNORMAL LOW (ref 6.5–8.1)

## 2018-12-09 LAB — CBC
HCT: 31.9 % — ABNORMAL LOW (ref 36.0–46.0)
Hemoglobin: 11.1 g/dL — ABNORMAL LOW (ref 12.0–15.0)
MCH: 32.4 pg (ref 26.0–34.0)
MCHC: 34.8 g/dL (ref 30.0–36.0)
MCV: 93 fL (ref 80.0–100.0)
Platelets: 174 10*3/uL (ref 150–400)
RBC: 3.43 MIL/uL — ABNORMAL LOW (ref 3.87–5.11)
RDW: 12.9 % (ref 11.5–15.5)
WBC: 18.3 10*3/uL — ABNORMAL HIGH (ref 4.0–10.5)
nRBC: 0 % (ref 0.0–0.2)

## 2018-12-09 MED ORDER — NIFEDIPINE ER OSMOTIC RELEASE 30 MG PO TB24
30.0000 mg | ORAL_TABLET | Freq: Every day | ORAL | Status: DC
  Administered 2018-12-10: 30 mg via ORAL
  Filled 2018-12-09: qty 1

## 2018-12-09 NOTE — Anesthesia Postprocedure Evaluation (Signed)
Anesthesia Post Note  Patient: Christine Rivas  Procedure(s) Performed: AN AD HOC LABOR EPIDURAL     Patient location during evaluation: Mother Baby Anesthesia Type: Epidural Level of consciousness: awake and alert and oriented Pain management: satisfactory to patient Vital Signs Assessment: post-procedure vital signs reviewed and stable Respiratory status: respiratory function stable Cardiovascular status: stable Postop Assessment: no headache, no backache, epidural receding, patient able to bend at knees, no signs of nausea or vomiting and adequate PO intake Anesthetic complications: no    Last Vitals:  Vitals:   12/09/18 0037 12/09/18 0519  BP: (!) 134/93 125/86  Pulse: 91 81  Resp: 18 18  Temp: 36.7 C   SpO2: 99% 100%    Last Pain:  Vitals:   12/09/18 0519  TempSrc: Oral  PainSc: 1    Pain Goal:                   Jacorion Klem

## 2018-12-09 NOTE — Progress Notes (Signed)
Patient ID: Christine Rivas, female   DOB: April 10, 1985, 33 y.o.   MRN: [id number] PPD # 1 S/P NSVD  Live born female  Birth Weight: 5 lb 3.1 oz (2356 g) APGAR: 8, 9  Newborn Delivery   Birth date/time: 12/08/2018 20:40:00 Delivery type: Vaginal, Spontaneous     Baby name: Christine Rivas Delivering provider: Juanna Cao  Episiotomy:None   Lacerations:1st degree   Feeding: breast  Pain control at delivery: Epidural   S:  Reports feeling well, minimal pain and very happy with birth experience             Tolerating po/ No nausea or vomiting             Bleeding is light             Pain controlled with ibuprofen (OTC)             Up ad lib / ambulatory / voiding without difficulties   O:  A & O x 3, in no apparent distress              VS:  Vitals:   12/08/18 2259 12/08/18 2315 12/09/18 0037 12/09/18 0519  BP:  (!) 141/101 (!) 134/93 125/86  Pulse: 90 93 91 81  Resp: '16 18 18 18  ' Temp: 98.1 F (36.7 C) 98.1 F (36.7 C) 98 F (36.7 C)   TempSrc: Oral Oral Oral Oral  SpO2: 98% 100% 99% 100%  Weight:      Height:        LABS:  Recent Labs    12/08/18 0814 12/09/18 0449  WBC 14.1* 18.3*  HGB 12.7 11.1*  HCT 36.3 31.9*  PLT 212 174   Hepatic Function Latest Ref Rng & Units 12/09/2018 12/07/2018 09/30/2018  Total Protein 6.5 - 8.1 g/dL 4.5(L) 5.5(L) 6.0(L)  Albumin 3.5 - 5.0 g/dL 2.0(L) 2.7(L) 3.2(L)  AST 15 - 41 U/L 22 19 33  ALT 0 - 44 U/L '13 15 14  ' Alk Phosphatase 38 - 126 U/L 56 61 50  Total Bilirubin 0.3 - 1.2 mg/dL 0.6 0.3 1.1    Blood type: --/--/A POS, A POS Performed at [facility] [address], [place], [place] [address]  [phone] 1012)  Rubella: Immune (04/21 0000)   I&O: I/O last 3 completed shifts: In: 0  Out: 1408 [YQMGN:0037; Blood:133]          No intake/output data recorded.  Vaccines: TDaP UTD         Flu    ordered   Lungs: Clear and unlabored  Heart: regular rate and rhythm / no murmurs  Abdomen: soft, non-tender, non-distended           Fundus: firm, non-tender, U-1  Perineum: repair intact, no edema, small external hemorrhoid, no inflamation  Lochia: small  Extremities: +1 pedal edema, no calf pain or tenderness    A/P: PPD # 1 32 y.o., G1P1001   Principal Problem:   Postpartum care following vaginal delivery 9/18 Active Problems:   Encounter for induction of labor   Gestational hypertension  - BP labile, no neural sx, PEC labs normal  - will start Procardia 30 xl, close F/U in office in 1 week for BP check   SVD (spontaneous vaginal delivery)   Perineal laceration with delivery, first degree and R labial splay   Doing well - stable status  Routine post partum orders  Anticipate discharge tomorrow  POC in consult with Dr. Ronita Hipps  Juliene Pina, MSN, CNM 12/09/2018, 9:25 AM

## 2018-12-09 NOTE — Lactation Note (Signed)
This note was copied from a baby's chart. Lactation Consultation Note  Patient Name: Christine Rivas [id number] Date: 12/09/2018 Reason for consult: Infant < 6lbs;Early term 37-38.6wks;[place] Baby is 37.1 weeks but SGA.  Birth weight 5-3.  Baby has had attempts at breast with one documented feeding using 5 french feeding tube at the breast.  Baby took 5 mls with that feeding.  Discussed with parents the Late Preterm feeding guidelines due to SGA.  Reviewed supplement volumes and frequency.  Baby is currently sleeping after bath.  Placed baby skin to skin in football hold on right side.  Right nipple is flat and left nipple short shafted.  Reviewed hand expression and small drop of colostrum expressed.  Baby did not wake with waking techniques or show any interest in feeding.  Explained to parents we needed to feed baby neosure.  Instructed on paced bottle feeding.  Baby slow to suck on slow flow nipple.  After several minutes of working with baby she took a total of 10 mls.  Symphony pump set up and initiated.  Instructed to pump every 3 hours x 15 minutes on initiate setting.  Breast shells given with instructions.  20 mm nipple shield left in room for possible use on right breast.  Instructed to feed with cues but at least 8 times in 24 hours.  Encouraged to call for assist prn.  Maternal Data Has patient been taught Hand Expression?: Yes Does the patient have breastfeeding experience prior to this delivery?: No  Feeding Feeding Type: Breast Fed  LATCH Score Latch: Too sleepy or reluctant, no latch achieved, no sucking elicited.  Audible Swallowing: None  Type of Nipple: Flat  Comfort (Breast/Nipple): Soft / non-tender  Hold (Positioning): Assistance needed to correctly position infant at breast and maintain latch.  LATCH Score: 4  Interventions    Lactation Tools Discussed/Used Pump Review: Setup, frequency, and cleaning;Milk Storage Initiated by:: [email or web address] Date initiated::  12/09/18   Consult Status Consult Status: Follow-up Date: 12/10/18 Follow-up type: In-patient    Ave Filter 12/09/2018, 12:42 PM

## 2018-12-10 MED ORDER — NIFEDIPINE ER 30 MG PO TB24: 30.0000 mg | ORAL_TABLET | Freq: Every day | ORAL | 0 refills | Status: DC

## 2018-12-10 NOTE — Lactation Note (Signed)
This note was copied from a baby's chart. Lactation Consultation Note  Patient Name: Christine Rivas [id number] Date: 12/10/2018 Reason for consult: Follow-up assessment;Infant < 6lbs;Early term 37-38.6wks Baby is 37 hours/6% weight loss.  Baby is working on taking more volumes with bottle feeds.  Last feeds 18-20 mls.  Mom is pumping every 3 hours and obtaining drops of colostrum.  We discussed continuing plan for now and following up with outpatient appointment after milk supply is well established.  Mom happy with plan.  Encouraged to call prn.  Maternal Data    Feeding Feeding Type: Bottle Fed - Formula Nipple Type: Slow - flow  LATCH Score                   Interventions    Lactation Tools Discussed/Used     Consult Status Consult Status: Follow-up Date: 12/11/18 Follow-up type: In-patient    Ave Filter 12/10/2018, 10:23 AM

## 2018-12-10 NOTE — Progress Notes (Signed)
Patient ID: Christine Rivas, female   DOB: 1985-04-01, 33 y.o.   MRN: [id number] PPD # 2 S/P NSVD  Live born female  Birth Weight: 5 lb 3.1 oz (2356 g) APGAR: 8, 9  Newborn Delivery   Birth date/time: 12/08/2018 20:40:00 Delivery type: Vaginal, Spontaneous     Baby name: [name] Delivering provider: Juanna Cao  Episiotomy:None   Lacerations:1st degree   Feeding: breast  Pain control at delivery: Epidural   S:  Reports feeling well, minimal pain and very happy with birth experience             Tolerating po/ No nausea or vomiting             Bleeding is light             Pain controlled with ibuprofen (OTC)             Up ad lib / ambulatory / voiding without difficulties   O:  A & O x 3, in no apparent distress              VS:  Vitals:   12/09/18 1349 12/09/18 2156 12/10/18 0410 12/10/18 0604  BP: (!) 118/93 128/79 119/90 114/70  Pulse: 88 89 86 (!) 108  Resp: _0 Temp: (!) 97.5 F (36.4 C) 98.7 F (37.1 C) (!) 97.5 F (36.4 C) 98.5 F (36.9 C)  TempSrc: Oral Axillary Axillary Axillary  SpO2:      Weight:      Height:        LABS:  Recent Labs    12/08/18 0814 12/09/18 0449  WBC 14.1* 18.3*  HGB 12.7 11.1*  HCT 36.3 31.9*  PLT 212 174   Hepatic Function Latest Ref Rng & Units 12/09/2018 12/07/2018 09/30/2018  Total Protein 6.5 - 8.1 g/dL 4.5(L) 5.5(L) 6.0(L)  Albumin 3.5 - 5.0 g/dL 2.0(L) 2.7(L) 3.2(L)  AST 15 - 41 U/L 22 19 33  ALT 0 - 44 U/L _1 Alk Phosphatase 38 - 126 U/L 56 61 50  Total Bilirubin 0.3 - 1.2 mg/dL 0.6 0.3 1.1    Blood type: --/--/A POS, A POS Performed at [facility] [address], [place], [place] [address]  [phone] 1012)  Rubella: Immune (04/21 0000)   I&O: I/O last 3 completed shifts: In: 0  Out: 408 [Urine:275; Blood:133]          No intake/output data recorded.  Vaccines: TDaP UTD         Flu    ordered   Lungs: Clear and unlabored  Heart: regular rate and rhythm / no murmurs  Abdomen: soft,  non-tender, non-distended             Fundus: firm, non-tender, U-1  Perineum: repair intact, no edema, small external hemorrhoid, no inflamation  Lochia: small  Extremities: +1 pedal edema, no calf pain or tenderness    A/P: PPD # 2 33 y.o., G1P1001   Principal Problem:   Postpartum care following vaginal delivery 9/18 Active Problems:   Encounter for induction of labor   Gestational hypertension  - BP improved, no neural sx, PEC labs normal  - Procardia 30 xl, close F/U in office in 1 week for BP check   SVD (spontaneous vaginal delivery)   Perineal laceration with delivery, first degree and R labial splay   Doing well - stable status  Routine post partum orders  discharge home    Shir Bergman J,  MSN, CNM 12/10/2018, 11:05 AM

## 2018-12-10 NOTE — Discharge Summary (Signed)
Obstetric Discharge Summary Reason for Admission: induction of labor Prenatal Procedures: NST Intrapartum Procedures: spontaneous vaginal delivery Postpartum Procedures: none Complications-Operative and Postpartum: 2 degree perineal laceration Hemoglobin  Date Value Ref Range Status  12/09/2018 11.1 (L) 12.0 - 15.0 g/dL Final   HCT  Date Value Ref Range Status  12/09/2018 31.9 (L) 36.0 - 46.0 % Final    Physical Exam:  General: alert, cooperative and appears stated age 23: appropriate Uterine Fundus: firm Incision: healing well DVT Evaluation: No evidence of DVT seen on physical exam.  Discharge Diagnoses: Term Pregnancy-delivered  Discharge Information: Date: 12/10/2018 Activity: unrestricted Diet: routine Medications: PNV, Ibuprofen and Colace Condition: stable Instructions: refer to practice specific booklet Discharge to: home   Newborn Data: Live born female  Birth Weight: 5 lb 3.1 oz (2356 g) APGAR: 8, 9  Newborn Delivery   Birth date/time: 12/08/2018 20:40:00 Delivery type: Vaginal, Spontaneous      Home with mother.  Marl Seago J 12/10/2018, 11:09 AM

## 2020-03-22 NOTE — L&D Delivery Note (Signed)
Delivery Note At 11:50 PM a viable female was delivered via Vaginal, Spontaneous (Presentation: Right Occiput Anterior).  APGAR: 8, 10; weight  .pending Placenta status: Spontaneous, Intact.  Cord: 3 vessels with the following complications: None.  Cord pH: NA  Anesthesia: Epidural Episiotomy: None Lacerations: Periurethral;1st degree;Perineal Suture Repair: 3.0 vicryl rapide Est. Blood Loss (mL): 150  Mom to postpartum.  Baby to Couplet care / Skin to Skin.  Robley Fries 02/10/2021, 12:10 AM

## 2020-06-19 LAB — OB RESULTS CONSOLE ABO/RH: RH Type: POSITIVE

## 2020-08-05 LAB — OB RESULTS CONSOLE RPR: RPR: NONREACTIVE

## 2020-08-05 LAB — OB RESULTS CONSOLE HEPATITIS B SURFACE ANTIGEN: Hepatitis B Surface Ag: NEGATIVE

## 2020-08-05 LAB — OB RESULTS CONSOLE HIV ANTIBODY (ROUTINE TESTING): HIV: NONREACTIVE

## 2020-08-05 LAB — OB RESULTS CONSOLE RUBELLA ANTIBODY, IGM: Rubella: IMMUNE

## 2020-08-05 LAB — OB RESULTS CONSOLE GC/CHLAMYDIA
Chlamydia: NEGATIVE
Gonorrhea: NEGATIVE

## 2020-11-03 ENCOUNTER — Other Ambulatory Visit: Payer: Self-pay

## 2020-11-03 ENCOUNTER — Inpatient Hospital Stay (HOSPITAL_COMMUNITY)
Admission: AD | Admit: 2020-11-03 | Discharge: 2020-11-03 | Disposition: A | Discharge status: Home / Self Care | Payer: 59 | Attending: Obstetrics | Admitting: Obstetrics

## 2020-11-03 ENCOUNTER — Inpatient Hospital Stay (HOSPITAL_COMMUNITY): Payer: 59

## 2020-11-03 ENCOUNTER — Encounter (HOSPITAL_COMMUNITY): Payer: Self-pay | Admitting: Obstetrics

## 2020-11-03 DIAGNOSIS — Z3A25 25 weeks gestation of pregnancy: Secondary | ICD-10-CM

## 2020-11-03 DIAGNOSIS — O99612 Diseases of the digestive system complicating pregnancy, second trimester: Secondary | ICD-10-CM | POA: Diagnosis not present

## 2020-11-03 DIAGNOSIS — N132 Hydronephrosis with renal and ureteral calculous obstruction: Secondary | ICD-10-CM | POA: Insufficient documentation

## 2020-11-03 DIAGNOSIS — O26892 Other specified pregnancy related conditions, second trimester: Secondary | ICD-10-CM | POA: Insufficient documentation

## 2020-11-03 DIAGNOSIS — Z7982 Long term (current) use of aspirin: Secondary | ICD-10-CM | POA: Diagnosis not present

## 2020-11-03 DIAGNOSIS — Z87442 Personal history of urinary calculi: Secondary | ICD-10-CM | POA: Diagnosis not present

## 2020-11-03 DIAGNOSIS — K219 Gastro-esophageal reflux disease without esophagitis: Secondary | ICD-10-CM | POA: Diagnosis not present

## 2020-11-03 DIAGNOSIS — N200 Calculus of kidney: Secondary | ICD-10-CM

## 2020-11-03 LAB — URINALYSIS, ROUTINE W REFLEX MICROSCOPIC
Bilirubin Urine: NEGATIVE
Glucose, UA: NEGATIVE mg/dL
Ketones, ur: 5 mg/dL — AB
Nitrite: NEGATIVE
Protein, ur: NEGATIVE mg/dL
RBC / HPF: >50 RBC/hpf — ABNORMAL HIGH (ref 0–5)
Specific Gravity, Urine: 1.015 (ref 1.005–1.030)
pH: 7 (ref 5.0–8.0)

## 2020-11-03 LAB — COMPREHENSIVE METABOLIC PANEL
ALT: 12 U/L (ref 0–44)
AST: 16 U/L (ref 15–41)
Albumin: 2.9 g/dL — ABNORMAL LOW (ref 3.5–5.0)
Alkaline Phosphatase: 50 U/L (ref 38–126)
Anion gap: 9 (ref 5–15)
BUN: 14 mg/dL (ref 6–20)
CO2: 21 mmol/L — ABNORMAL LOW (ref 22–32)
Calcium: 8.5 mg/dL — ABNORMAL LOW (ref 8.9–10.3)
Chloride: 101 mmol/L (ref 98–111)
Creatinine, Ser: 0.61 mg/dL (ref 0.44–1.00)
GFR, Estimated: >60 mL/min (ref >60)
Glucose, Bld: 112 mg/dL — ABNORMAL HIGH (ref 70–99)
Potassium: 3.9 mmol/L (ref 3.5–5.1)
Sodium: 131 mmol/L — ABNORMAL LOW (ref 135–145)
Total Bilirubin: 0.4 mg/dL (ref 0.3–1.2)
Total Protein: 5.9 g/dL — ABNORMAL LOW (ref 6.5–8.1)

## 2020-11-03 LAB — CBC
HCT: 34.4 % — ABNORMAL LOW (ref 36.0–46.0)
Hemoglobin: 12.1 g/dL (ref 12.0–15.0)
MCH: 31.4 pg (ref 26.0–34.0)
MCHC: 35.2 g/dL (ref 30.0–36.0)
MCV: 89.4 fL (ref 80.0–100.0)
Platelets: 265 10*3/uL (ref 150–400)
RBC: 3.85 MIL/uL — ABNORMAL LOW (ref 3.87–5.11)
RDW: 12.2 % (ref 11.5–15.5)
WBC: 15.2 10*3/uL — ABNORMAL HIGH (ref 4.0–10.5)
nRBC: 0 % (ref 0.0–0.2)

## 2020-11-03 MED ORDER — FAMOTIDINE 20 MG PO TABS: 20.0000 mg | ORAL_TABLET | Freq: Two times a day (BID) | ORAL | 1 refills | Status: AC

## 2020-11-03 MED ORDER — FAMOTIDINE 20 MG PO TABS: 20.0000 mg | ORAL_TABLET | Freq: Two times a day (BID) | ORAL | 1 refills | Status: DC

## 2020-11-03 NOTE — MAU Note (Signed)
Christine Rivas is a 35 y.o. at [redacted]w[redacted]d here in MAU reporting: woke up around 0330 with sharp left sided flank pain. Pain is causing her vomit. Hx of kidney stone with previous pregnancy.   Onset of complaint: today  Pain score: 10/10  Vitals:   11/03/20 0859  BP: 122/80  Pulse: (!) 104  Resp: 20  Temp: (!) 97.4 F (36.3 C)  SpO2: 100%     FHT:148  Lab orders placed from triage: UA

## 2020-11-03 NOTE — MAU Provider Note (Signed)
History     CSN: [id number]  Arrival date and time: 11/03/20 0847   Event Date/Time   First Provider Initiated Contact with Patient 11/03/20 [phone]      Chief Complaint  Patient presents with   Back Pain   HPI 35-year-old G2 P1-0-0-1 at 25weeks history of nephrolithiasis in her previous pregnancy.  Patient presents with Left flank pain that started approximately 3am and has been worsening since that time.  Her pain has been similar to previous episode of kidney stones.  On arrival, the patient passed a kidney stone given the urine sample.  Her pain immediately improved, but she is continued to have some mild pain (2 out of 10) on her left flank.  The pain is throbbing and does not radiate.  No other palliating or provoking factors.  Denies fevers, chills, chest pain, shortness of breath.  Does have some nausea and vomited twice.  She has been taking 2-3 Tums per day due to GERD.  OB History     Gravida  2   Para  1   Term  1   Preterm      AB      Living  1      SAB      IAB      Ectopic      Multiple  0   Live Births  1           Past Medical History:  Diagnosis Date   Medical history non-contributory     Past Surgical History:  Procedure Laterality Date   TYMPANOSTOMY TUBE PLACEMENT     as an infant   WISDOM TOOTH EXTRACTION      Family History  Problem Relation Age of Onset   Hypertension Father    Glaucoma Father    Cleft lip Father    Cleft palate Father    Retinitis pigmentosa Father     Social History   Tobacco Use   Smoking status: Never   Smokeless tobacco: Never  Vaping Use   Vaping Use: Never used  Substance Use Topics   Alcohol use: Not Currently    Comment: socially prior to pregnancy   Drug use: Not Currently    Allergies: No Known Allergies  Medications Prior to Admission  Medication Sig Dispense Refill Last Dose   aspirin EC 81 MG tablet Take 81 mg by mouth daily. Swallow whole.   11/02/2020   HYDROmorphone  (DILAUDID) 2 MG tablet Take 2 mg by mouth every 4 (four) hours as needed for severe pain.   11/03/2020 at 0400   Multiple Vitamin (MULTIVITAMIN) tablet Take 1 tablet by mouth daily. Prenatal one-a-day   11/02/2020   calcium carbonate (TUMS - DOSED IN MG ELEMENTAL CALCIUM) 500 MG chewable tablet Chew 1 tablet by mouth as needed for indigestion or heartburn.      ferrous sulfate 325 (65 FE) MG tablet Take 325 mg by mouth every other day.      NIFEdipine (ADALAT CC) 30 MG 24 hr tablet Take 1 tablet (30 mg total) by mouth daily. 30 tablet 0     Review of Systems  All other systems reviewed and are negative. Physical Exam   Blood pressure 120/88, pulse 92, temperature (!) 97.4 F (36.3 C), temperature source Oral, resp. rate 20, height 4\' 11"  (1.499 m), weight 89.6 kg, SpO2 100 %, unknown if currently breastfeeding.  Physical Exam Vitals reviewed.  Constitutional:      Appearance: Normal appearance.  HENT:  Head: Normocephalic and atraumatic.  Cardiovascular:     Rate and Rhythm: Normal rate and regular rhythm.     Pulses: Normal pulses.     Heart sounds: Normal heart sounds.  Pulmonary:     Effort: Pulmonary effort is normal.     Breath sounds: Normal breath sounds.  Abdominal:     General: Abdomen is flat. There is no distension.     Palpations: Abdomen is soft. There is no mass.     Tenderness: There is no abdominal tenderness. There is left CVA tenderness. There is no right CVA tenderness.     Hernia: No hernia is present.  Skin:    General: Skin is warm and dry.     Capillary Refill: Capillary refill takes less than 2 seconds.  Neurological:     General: No focal deficit present.     Mental Status: She is alert and oriented to person, place, and time.  Psychiatric:        Mood and Affect: Mood normal.        Behavior: Behavior normal.        Thought Content: Thought content normal.   Results for orders placed or performed during the hospital encounter of 11/03/20 (from the  past 24 hour(s))  Urinalysis, Routine w reflex microscopic Urine, Clean Catch     Status: Abnormal   Collection Time: 11/03/20  8:53 AM  Result Value Ref Range   Color, Urine YELLOW YELLOW   APPearance HAZY (A) CLEAR   Specific Gravity, Urine 1.015 1.005 - 1.030   pH 7.0 5.0 - 8.0   Glucose, UA NEGATIVE NEGATIVE mg/dL   Hgb urine dipstick MODERATE (A) NEGATIVE   Bilirubin Urine NEGATIVE NEGATIVE   Ketones, ur 5 (A) NEGATIVE mg/dL   Protein, ur NEGATIVE NEGATIVE mg/dL   Nitrite NEGATIVE NEGATIVE   Leukocytes,Ua TRACE (A) NEGATIVE   RBC / HPF >50 (H) 0 - 5 RBC/hpf   WBC, UA 0-5 0 - 5 WBC/hpf   Bacteria, UA RARE (A) NONE SEEN   Squamous Epithelial / LPF 0-5 0 - 5   Mucus PRESENT   CBC     Status: Abnormal   Collection Time: 11/03/20  9:40 AM  Result Value Ref Range   WBC 15.2 (H) 4.0 - 10.5 K/uL   RBC 3.85 (L) 3.87 - 5.11 MIL/uL   Hemoglobin 12.1 12.0 - 15.0 g/dL   HCT 86.5 (L) 78.4 - 69.6 %   MCV 89.4 80.0 - 100.0 fL   MCH 31.4 26.0 - 34.0 pg   MCHC 35.2 30.0 - 36.0 g/dL   RDW 29.5 28.4 - 13.2 %   Platelets 265 150 - 400 K/uL   nRBC 0.0 0.0 - 0.2 %  Comprehensive metabolic panel     Status: Abnormal   Collection Time: 11/03/20  9:40 AM  Result Value Ref Range   Sodium 131 (L) 135 - 145 mmol/L   Potassium 3.9 3.5 - 5.1 mmol/L   Chloride 101 98 - 111 mmol/L   CO2 21 (L) 22 - 32 mmol/L   Glucose, Bld 112 (H) 70 - 99 mg/dL   BUN 14 6 - 20 mg/dL   Creatinine, Ser 4.40 0.44 - 1.00 mg/dL   Calcium 8.5 (L) 8.9 - 10.3 mg/dL   Total Protein 5.9 (L) 6.5 - 8.1 g/dL   Albumin 2.9 (L) 3.5 - 5.0 g/dL   AST 16 15 - 41 U/L   ALT 12 0 - 44 U/L   Alkaline Phosphatase 50  38 - 126 U/L   Total Bilirubin 0.4 0.3 - 1.2 mg/dL   GFR, Estimated >00 >93 mL/min   Anion gap 9 5 - 15    US RENAL  Result Date: 11/03/2020 CLINICAL DATA:  35 year old female is pregnant in the 2nd trimester with left side flank pain. History of kidney stones. EXAM: RENAL / URINARY TRACT ULTRASOUND COMPLETE  COMPARISON:  Renal ultrasound 01/29/2019. FINDINGS: Right Kidney: Renal measurements: 11.4 x 5.2 x 5.3 cm = volume: 163 mL. Echogenicity within normal limits. Mild right hydronephrosis when compared to the 2020 ultrasound (series 1, image 6 today versus series 2, image 7 at that time). No right renal mass. No calculi evident by ultrasound today. Left Kidney: Renal measurements: 11.0 x 5.4 x 5.4 cm = volume: 168 mL. Echogenicity within normal limits. Similar borderline to mild left hydronephrosis (image 24). No left renal cyst or mass. No calculi evident by ultrasound today. Bladder: Diminutive, otherwise unremarkable.  Fetal head visible. Other: None. IMPRESSION: Symmetric mild bilateral hydronephrosis when compared to a 2020 ultrasound. Favor maternal hydronephrosis of pregnancy. Electronically Signed   By: Odessa Fleming M.D.   On: 11/03/2020 10:25     MAU Course  Procedures Tenderness to NST: Baseline grade proximal moderate.  Ability.  No accelerations no decelerations.  MDM Will check CBC, CMP.  Stone to pathology.  We will check renal ultrasound to evaluate for residual kidney stones.  Assessment and Plan   1. [redacted] weeks gestation of pregnancy   2. Kidney stone   3. Nephrolithiasis    No further stones visualized. Stop tums as the bicarb and calcium increases calcium oxylate stone formation. Start Pepcid. D/c to home.  Christine Rivas 11/03/2020, 9:31 AM

## 2021-01-21 LAB — OB RESULTS CONSOLE GBS: GBS: NEGATIVE

## 2021-02-04 ENCOUNTER — Other Ambulatory Visit: Payer: Self-pay | Admitting: Obstetrics & Gynecology

## 2021-02-09 ENCOUNTER — Encounter (HOSPITAL_COMMUNITY): Payer: Self-pay | Admitting: Obstetrics & Gynecology

## 2021-02-09 ENCOUNTER — Other Ambulatory Visit: Payer: Self-pay

## 2021-02-09 ENCOUNTER — Inpatient Hospital Stay (HOSPITAL_COMMUNITY)
Admission: AD | Admit: 2021-02-09 | Discharge: 2021-02-11 | DRG: 807 | Disposition: A | Discharge status: Home / Self Care | Payer: 59 | Attending: Obstetrics & Gynecology | Admitting: Obstetrics & Gynecology

## 2021-02-09 ENCOUNTER — Inpatient Hospital Stay (HOSPITAL_COMMUNITY): Payer: 59 | Admitting: Anesthesiology

## 2021-02-09 ENCOUNTER — Inpatient Hospital Stay (HOSPITAL_COMMUNITY): Payer: 59

## 2021-02-09 ENCOUNTER — Other Ambulatory Visit: Payer: Self-pay | Admitting: Obstetrics & Gynecology

## 2021-02-09 DIAGNOSIS — Z3A39 39 weeks gestation of pregnancy: Secondary | ICD-10-CM

## 2021-02-09 DIAGNOSIS — Z20822 Contact with and (suspected) exposure to covid-19: Secondary | ICD-10-CM | POA: Diagnosis present

## 2021-02-09 DIAGNOSIS — I959 Hypotension, unspecified: Secondary | ICD-10-CM | POA: Diagnosis not present

## 2021-02-09 DIAGNOSIS — O26893 Other specified pregnancy related conditions, third trimester: Secondary | ICD-10-CM | POA: Diagnosis not present

## 2021-02-09 DIAGNOSIS — O3663X0 Maternal care for excessive fetal growth, third trimester, not applicable or unspecified: Principal | ICD-10-CM | POA: Diagnosis present

## 2021-02-09 DIAGNOSIS — Z3490 Encounter for supervision of normal pregnancy, unspecified, unspecified trimester: Secondary | ICD-10-CM | POA: Diagnosis present

## 2021-02-09 LAB — CBC
HCT: 34.9 % — ABNORMAL LOW (ref 36.0–46.0)
Hemoglobin: 11.5 g/dL — ABNORMAL LOW (ref 12.0–15.0)
MCH: 30 pg (ref 26.0–34.0)
MCHC: 33 g/dL (ref 30.0–36.0)
MCV: 91.1 fL (ref 80.0–100.0)
Platelets: 262 10*3/uL (ref 150–400)
RBC: 3.83 MIL/uL — ABNORMAL LOW (ref 3.87–5.11)
RDW: 12.5 % (ref 11.5–15.5)
WBC: 9.4 10*3/uL (ref 4.0–10.5)
nRBC: 0 % (ref 0.0–0.2)

## 2021-02-09 LAB — RESP PANEL BY RT-PCR (FLU A&B, COVID) ARPGX2
Influenza A by PCR: NEGATIVE
Influenza B by PCR: NEGATIVE
SARS Coronavirus 2 by RT PCR: NEGATIVE

## 2021-02-09 LAB — TYPE AND SCREEN
ABO/RH(D): A POS
Antibody Screen: NEGATIVE

## 2021-02-09 MED ORDER — FENTANYL-BUPIVACAINE-NACL 0.5-0.125-0.9 MG/250ML-% EP SOLN
12.0000 mL/h | EPIDURAL | Status: DC | PRN
  Administered 2021-02-09: 12 mL/h via EPIDURAL
  Filled 2021-02-09: qty 250

## 2021-02-09 MED ORDER — OXYTOCIN BOLUS FROM INFUSION
333.0000 mL | Freq: Once | INTRAVENOUS | Status: AC
  Administered 2021-02-10: 333 mL via INTRAVENOUS

## 2021-02-09 MED ORDER — LACTATED RINGERS IV SOLN: INTRAVENOUS | Status: DC

## 2021-02-09 MED ORDER — LIDOCAINE-EPINEPHRINE (PF) 1.5 %-1:200000 IJ SOLN
INTRAMUSCULAR | Status: DC | PRN
  Administered 2021-02-09: 5 mL via EPIDURAL

## 2021-02-09 MED ORDER — SOD CITRATE-CITRIC ACID 500-334 MG/5ML PO SOLN: 30.0000 mL | ORAL | Status: DC | PRN

## 2021-02-09 MED ORDER — PHENYLEPHRINE 40 MCG/ML (10ML) SYRINGE FOR IV PUSH (FOR BLOOD PRESSURE SUPPORT)
80.0000 ug | PREFILLED_SYRINGE | INTRAVENOUS | Status: DC | PRN
  Filled 2021-02-09: qty 10

## 2021-02-09 MED ORDER — LACTATED RINGERS IV SOLN
500.0000 mL | INTRAVENOUS | Status: DC | PRN
  Administered 2021-02-09: 500 mL via INTRAVENOUS

## 2021-02-09 MED ORDER — LACTATED RINGERS IV SOLN
500.0000 mL | Freq: Once | INTRAVENOUS | Status: AC
  Administered 2021-02-09: 500 mL via INTRAVENOUS

## 2021-02-09 MED ORDER — DIPHENHYDRAMINE HCL 50 MG/ML IJ SOLN: 12.5000 mg | INTRAMUSCULAR | Status: DC | PRN

## 2021-02-09 MED ORDER — PHENYLEPHRINE 40 MCG/ML (10ML) SYRINGE FOR IV PUSH (FOR BLOOD PRESSURE SUPPORT)
80.0000 ug | PREFILLED_SYRINGE | INTRAVENOUS | Status: DC | PRN
  Administered 2021-02-09: 80 ug via INTRAVENOUS

## 2021-02-09 MED ORDER — ACETAMINOPHEN 325 MG PO TABS: 650.0000 mg | ORAL_TABLET | ORAL | Status: DC | PRN

## 2021-02-09 MED ORDER — EPHEDRINE 5 MG/ML INJ: 10.0000 mg | INTRAVENOUS | Status: DC | PRN

## 2021-02-09 MED ORDER — OXYTOCIN-SODIUM CHLORIDE 30-0.9 UT/500ML-% IV SOLN
2.5000 [IU]/h | INTRAVENOUS | Status: DC
  Administered 2021-02-10: 2.5 [IU]/h via INTRAVENOUS

## 2021-02-09 MED ORDER — LIDOCAINE HCL (PF) 1 % IJ SOLN: 30.0000 mL | INTRAMUSCULAR | Status: DC | PRN

## 2021-02-09 MED ORDER — OXYTOCIN-SODIUM CHLORIDE 30-0.9 UT/500ML-% IV SOLN
1.0000 m[IU]/min | INTRAVENOUS | Status: DC
  Administered 2021-02-09: 2 m[IU]/min via INTRAVENOUS
  Filled 2021-02-09: qty 500

## 2021-02-09 MED ORDER — ONDANSETRON HCL 4 MG/2ML IJ SOLN
4.0000 mg | Freq: Four times a day (QID) | INTRAMUSCULAR | Status: DC | PRN
  Administered 2021-02-09: 4 mg via INTRAVENOUS
  Filled 2021-02-09: qty 2

## 2021-02-09 MED ORDER — TERBUTALINE SULFATE 1 MG/ML IJ SOLN: 0.2500 mg | Freq: Once | INTRAMUSCULAR | Status: DC | PRN

## 2021-02-09 MED ORDER — LIDOCAINE HCL (PF) 1 % IJ SOLN
INTRAMUSCULAR | Status: DC | PRN
  Administered 2021-02-09: 8 mL via EPIDURAL

## 2021-02-09 NOTE — Anesthesia Procedure Notes (Signed)
Epidural Patient location during procedure: OB Start time: 02/09/2021 8:10 PM End time: 02/09/2021 8:18 PM  Staffing Anesthesiologist: Atilano Median, DO Performed: anesthesiologist   Preanesthetic Checklist Completed: patient identified, IV checked, site marked, risks and benefits discussed, surgical consent, monitors and equipment checked, pre-op evaluation and timeout performed  Epidural Patient position: sitting Prep: ChloraPrep Patient monitoring: heart rate, continuous pulse ox and blood pressure Approach: midline Location: L4-L5 Injection technique: LOR saline  Needle:  Needle type: Tuohy  Needle gauge: 17 G Needle length: 9 cm Needle insertion depth: 8 cm Catheter type: closed end flexible Catheter size: 20 Guage Catheter at skin depth: 13 cm Test dose: negative and 1.5% lidocaine  Assessment Events: blood not aspirated, injection not painful, no injection resistance and no paresthesia  Additional Notes  Patient identified. Risks/Benefits/Options discussed with patient including but not limited to bleeding, infection, nerve damage, paralysis, failed block, incomplete pain control, headache, blood pressure changes, nausea, vomiting, reactions to medications, itching and postpartum back pain. Confirmed with bedside nurse the patient's most recent platelet count. Confirmed with patient that they are not currently taking any anticoagulation, have any bleeding history or any family history of bleeding disorders. Patient expressed understanding and wished to proceed. All questions were answered. Sterile technique was used throughout the entire procedure. Please see nursing notes for vital signs. Test dose was given through epidural catheter and negative prior to continuing to dose epidural or start infusion. Warning signs of high block given to the patient including shortness of breath, tingling/numbness in hands, complete motor block, or any concerning symptoms with  instructions to call for help. Patient was given instructions on fall risk and not to get out of bed. All questions and concerns addressed with instructions to call with any issues or inadequate analgesia.    Reason for block:procedure for pain

## 2021-02-09 NOTE — Anesthesia Preprocedure Evaluation (Addendum)
Anesthesia Evaluation  Patient identified by MRN, date of birth, ID band Patient awake    Reviewed: Allergy & Precautions, NPO status , Patient's Chart, lab work & pertinent test results  Airway Mallampati: II  TM Distance: >3 FB Neck ROM: Full    Dental no notable dental hx.    Pulmonary neg pulmonary ROS,    Pulmonary exam normal        Cardiovascular hypertension,  Rhythm:Regular Rate:Normal     Neuro/Psych negative neurological ROS  negative psych ROS   GI/Hepatic negative GI ROS, Neg liver ROS,   Endo/Other  negative endocrine ROS  Renal/GU negative Renal ROS  negative genitourinary   Musculoskeletal negative musculoskeletal ROS (+)   Abdominal Normal abdominal exam  (+)   Peds  Hematology negative hematology ROS (+)   Anesthesia Other Findings   Reproductive/Obstetrics (+) Pregnancy                             Anesthesia Physical Anesthesia Plan  ASA: 2  Anesthesia Plan: Epidural   Post-op Pain Management:    Induction:   PONV Risk Score and Plan: 2 and Treatment may vary due to age or medical condition  Airway Management Planned: Natural Airway  Additional Equipment: None  Intra-op Plan:   Post-operative Plan:   Informed Consent: I have reviewed the patients History and Physical, chart, labs and discussed the procedure including the risks, benefits and alternatives for the proposed anesthesia with the patient or authorized representative who has indicated his/her understanding and acceptance.     Dental advisory given  Plan Discussed with:   Anesthesia Plan Comments: (Lab Results      Component                Value               Date                      WBC                      9.4                 02/09/2021                HGB                      11.5 (L)            02/09/2021                HCT                      34.9 (L)            02/09/2021                 MCV                      91.1                02/09/2021                PLT                      262                 02/09/2021          )  Anesthesia Quick Evaluation  

## 2021-02-09 NOTE — H&P (Signed)
Christine Rivas is a 35 y.o. female presenting for IOL at 39 wks for favorable cervix and suspect LGA 35 yo. G2P1001. Irreg menses- long interval  Sono- Live IUP 7.1 wks, [facility] 02/16/21. S<D, dating by sono G1- GHTN hx. IOL 37 wks. Christine Rivas, 5'3" , on bbASA from 10 wks.   This preg- nl labs. AFP1 low risk. Anatomy isolated CPC, no other markers. Growth AGA at 28, 34 wks- last was 10/19 - 5'14" at 75% AC 92%, BPD 57%, AFI 18 cm  Vx  GBs (-)   OB History     Gravida  2   Para  1   Term  1   Preterm      AB      Living  1      SAB      IAB      Ectopic      Multiple  0   Live Births  1          Past Medical History:  Diagnosis Date   Medical history non-contributory    Past Surgical History:  Procedure Laterality Date   TYMPANOSTOMY TUBE PLACEMENT     as an infant   WISDOM TOOTH EXTRACTION     Family History: family history includes Cleft lip in her father; Cleft palate in her father; Glaucoma in her father; Hypertension in her father; Retinitis pigmentosa in her father. Social History:  reports that she has never smoked. She has never used smokeless tobacco. She reports that she does not currently use alcohol. She reports that she does not use drugs.     Maternal Diabetes: No Genetic Screening: Declined  AFP1 low risk  Maternal Ultrasounds/Referrals: Isolated choroid plexus cyst Fetal Ultrasounds or other Referrals:  None Maternal Substance Abuse:  No Significant Maternal Medications:  None Significant Maternal Lab Results:  Group B Strep negative Other Comments:  None  Review of Systems History   unknown if currently breastfeeding. Exam Physical Exam  BP (!) 117/91   Pulse (!) 106   Physical exam:  A&O x 3, no acute distress. Pleasant HEENT neg, no thyromegaly Lungs CTA bilat CV RRR, S1S2 normal Abdo soft, non tender, non acute Extr no edema/ tenderness Pelvic 4/ 50%/ -3 ballotable -- controlled AROM, clear AF, copious  FHT 140s cat I Toco  irreg   Prenatal labs: ABO, Rh: A/Positive/-- (03/31 0000) Antibody:  neg Rubella: Immune (05/17 0000) RPR: Nonreactive (05/17 0000)  HBsAg: Negative (05/17 0000)  HIV: Non-reactive (05/17 0000)  GBS: Negative/-- (11/02 0000)  AFP1 neg Glucola nl   Assessment/Plan: IOL 39 wks for suspected LGA and favorable cervix. EFW 8 lbs. GBS(-) S/p AROM, pitocin as needed   Working towards SVD  Epidural by choice   Christine Rivas 02/09/2021, 5:40 PM

## 2021-02-10 ENCOUNTER — Encounter (HOSPITAL_COMMUNITY): Payer: Self-pay | Admitting: Obstetrics & Gynecology

## 2021-02-10 LAB — CBC
HCT: 32.3 % — ABNORMAL LOW (ref 36.0–46.0)
Hemoglobin: 10.8 g/dL — ABNORMAL LOW (ref 12.0–15.0)
MCH: 30.4 pg (ref 26.0–34.0)
MCHC: 33.4 g/dL (ref 30.0–36.0)
MCV: 91 fL (ref 80.0–100.0)
Platelets: 206 10*3/uL (ref 150–400)
RBC: 3.55 MIL/uL — ABNORMAL LOW (ref 3.87–5.11)
RDW: 12.3 % (ref 11.5–15.5)
WBC: 15.4 10*3/uL — ABNORMAL HIGH (ref 4.0–10.5)
nRBC: 0 % (ref 0.0–0.2)

## 2021-02-10 LAB — RPR: RPR Ser Ql: NONREACTIVE

## 2021-02-10 LAB — SARS CORONAVIRUS 2 (TAT 6-24 HRS): SARS Coronavirus 2: NEGATIVE

## 2021-02-10 MED ORDER — ONE-DAILY MULTI VITAMINS PO TABS: 1.0000 | ORAL_TABLET | Freq: Every day | ORAL | Status: DC

## 2021-02-10 MED ORDER — BENZOCAINE-MENTHOL 20-0.5 % EX AERO
1.0000 "application " | INHALATION_SPRAY | CUTANEOUS | Status: DC | PRN
  Administered 2021-02-10: 1 via TOPICAL
  Filled 2021-02-10: qty 56

## 2021-02-10 MED ORDER — ONDANSETRON HCL 4 MG/2ML IJ SOLN: 4.0000 mg | INTRAMUSCULAR | Status: DC | PRN

## 2021-02-10 MED ORDER — WITCH HAZEL-GLYCERIN EX PADS: 1.0000 "application " | MEDICATED_PAD | CUTANEOUS | Status: DC | PRN

## 2021-02-10 MED ORDER — TETANUS-DIPHTH-ACELL PERTUSSIS 5-2.5-18.5 LF-MCG/0.5 IM SUSY: 0.5000 mL | PREFILLED_SYRINGE | Freq: Once | INTRAMUSCULAR | Status: DC

## 2021-02-10 MED ORDER — DIBUCAINE (PERIANAL) 1 % EX OINT: 1.0000 "application " | TOPICAL_OINTMENT | CUTANEOUS | Status: DC | PRN

## 2021-02-10 MED ORDER — DIPHENHYDRAMINE HCL 25 MG PO CAPS: 25.0000 mg | ORAL_CAPSULE | Freq: Four times a day (QID) | ORAL | Status: DC | PRN

## 2021-02-10 MED ORDER — SENNOSIDES-DOCUSATE SODIUM 8.6-50 MG PO TABS
2.0000 | ORAL_TABLET | Freq: Every day | ORAL | Status: DC
  Administered 2021-02-11: 2 via ORAL
  Filled 2021-02-10: qty 2

## 2021-02-10 MED ORDER — SIMETHICONE 80 MG PO CHEW: 80.0000 mg | CHEWABLE_TABLET | ORAL | Status: DC | PRN

## 2021-02-10 MED ORDER — FERROUS SULFATE 325 (65 FE) MG PO TABS
325.0000 mg | ORAL_TABLET | ORAL | Status: DC
  Administered 2021-02-10: 325 mg via ORAL
  Filled 2021-02-10: qty 1

## 2021-02-10 MED ORDER — ZOLPIDEM TARTRATE 5 MG PO TABS: 5.0000 mg | ORAL_TABLET | Freq: Every evening | ORAL | Status: DC | PRN

## 2021-02-10 MED ORDER — PRENATAL MULTIVITAMIN CH
1.0000 | ORAL_TABLET | Freq: Every day | ORAL | Status: DC
  Administered 2021-02-10 – 2021-02-11 (×2): 1 via ORAL
  Filled 2021-02-10 (×2): qty 1

## 2021-02-10 MED ORDER — IBUPROFEN 600 MG PO TABS
600.0000 mg | ORAL_TABLET | Freq: Four times a day (QID) | ORAL | Status: DC
  Administered 2021-02-10 – 2021-02-11 (×6): 600 mg via ORAL
  Filled 2021-02-10 (×6): qty 1

## 2021-02-10 MED ORDER — ACETAMINOPHEN 325 MG PO TABS
650.0000 mg | ORAL_TABLET | ORAL | Status: DC | PRN
  Administered 2021-02-10: 650 mg via ORAL
  Filled 2021-02-10: qty 2

## 2021-02-10 MED ORDER — ONDANSETRON HCL 4 MG PO TABS: 4.0000 mg | ORAL_TABLET | ORAL | Status: DC | PRN

## 2021-02-10 MED ORDER — COCONUT OIL OIL: 1.0000 "application " | TOPICAL_OIL | Status: DC | PRN

## 2021-02-10 MED ORDER — FAMOTIDINE 20 MG PO TABS
20.0000 mg | ORAL_TABLET | Freq: Two times a day (BID) | ORAL | Status: DC
  Administered 2021-02-10 (×2): 20 mg via ORAL
  Filled 2021-02-10 (×3): qty 1

## 2021-02-10 NOTE — Plan of Care (Signed)
Vaginal delivery of viable female at 74. Care plan complete.

## 2021-02-10 NOTE — Lactation Note (Signed)
This note was copied from a baby's chart. Lactation Consultation Note  Patient Name: Christine Rivas ELFYB'O Date: 02/10/2021 Reason for consult: Initial assessment;Mother's request;Difficult latch;Term Age:33 hours Per mom, infant has not latched, mom made attempts. Per mom, she leaked colostrum in 3rd trimester of her pregnancy. Mom attempted to latch infant in the football hold position, infant only open mouth would not form ceil around nipple. LC taught mom hand expression with doll module, colostrum present, mom given hand pump and pumped 7 mls of colostrum that was spoon fed to infant.  Mom will continue to work towards latching infant at the breast and will ask RN/LC for latch assistance. Mom knows to breastfeed infant according to feeding cues, 8 to 12 or more times within 24 hours, skin to skin. Mom will give infant back any colostrum from hand expression and using hand pump if infant doesn't latch at the breast. Mom made aware of O/P services, breastfeeding support groups, community resources, and our phone # for post-discharge questions.    Maternal Data Has patient been taught Hand Expression?: Yes Does the patient have breastfeeding experience prior to this delivery?: Yes How long did the patient breastfeed?: Per mom, she only pumped 2 weeks then had low milk supply, her daughter never latched at the breast.  Feeding Mother's Current Feeding Choice: Breast Milk  LATCH Score Latch: Too sleepy or reluctant, no latch achieved, no sucking elicited.  Audible Swallowing: None  Type of Nipple: Everted at rest and after stimulation  Comfort (Breast/Nipple): Soft / non-tender  Hold (Positioning): Assistance needed to correctly position infant at breast and maintain latch.  LATCH Score: 5   Lactation Tools Discussed/Used Tools: Pump Breast pump type: Manual Pump Education: Setup, frequency, and cleaning;Milk Storage Pumping frequency: Mom will use hand pump and give  infant back EBM if infant doesn't latch at the breast. Pumped volume: 7 mL  Interventions Interventions: Breast feeding basics reviewed;Assisted with latch;Skin to skin;Hand express;Breast compression;Adjust position;Support pillows;Position options;Expressed milk;Hand pump;Education;LC Services brochure  Discharge Pump: Personal;Manual WIC Program: No  Consult Status Consult Status: Follow-up Date: 02/10/21 Follow-up type: In-patient    [name] 02/10/2021, 2:43 AM

## 2021-02-10 NOTE — Progress Notes (Addendum)
Patient ID: Christine Rivas, female   DOB: [birth year], 35 y.o.   MRN: [id number] FHT noted in down, arrived to room, in decel, now 8 min with maternal hypotension post epidural  Lateral turning was causing baby to move out of the pelvis, advised propped up, FHT recovered to 140s, FSE placed. Cx 8/100%/-2 Vx Pitocin turned off for FHT recovery, was at 4 mu. Can restart if UCs space out  FHT now cat I

## 2021-02-10 NOTE — Lactation Note (Signed)
This note was copied from a baby's chart. Lactation Consultation Note  Patient Name: Christine Rivas EAVWU'J Date: 02/10/2021 Reason for consult: Mother's request;Difficult latch;Follow-up assessment;Term Age:33 hours Per mom, infant has not been latching at the breast she has been supplementing infant with her EBM that she pumps from her Personal DEBP and recently started supplementing with formula to increase volume. Per mom, infant has been spitty since last night. Mom wanted assistance with latching infant at the breast, mom latched infant on her right breast infant did improve  compared to last night, he is now forming a ceil around breast, suck few times and stop only holding the nipple in his mouth with feeding. Per parents, they have been trying to offer formula but infant takes only maybe 1 or 2 mls, mom has been using her Doctor's Brown nipples from home. LC used Nfant ( white nipple) feed infant in side -lying position with chin support and infant took 12 mls being pace feed. LC observed infant has disorganized suck and with yellow nipple and brown's infant wasn't forming a ceil and leaking formula out the side of his mouth, infant could benefit from SLP consult. Mom's plan: 1- Mom will continue to work towards latching infant at the breast every feeding and breastfeed infant according to feeding cues, 8 to 12 times with in 24 hours, skin to skin. 2- Mom will continue to ask RN/LC for latch assistance if needed. 3- Mom knows if infant doesn't latch to continue to supplement infant with her EBM that is pumped and then formula, mom will offer 10+ mls or as much as infant wants per feeding  the first 24 hours of life. 4- Mom will continue to use Personal DEBP and give infant back her EBM.  Maternal Data    Feeding Mother's Current Feeding Choice: Breast Milk and Formula  LATCH Score Latch: Too sleepy or reluctant, no latch achieved, no sucking elicited.  Audible Swallowing: A few  with stimulation  Type of Nipple: Everted at rest and after stimulation  Comfort (Breast/Nipple): Soft / non-tender  Hold (Positioning): Assistance needed to correctly position infant at breast and maintain latch.  LATCH Score: 6   Lactation Tools Discussed/Used Tools: Pump Breast pump type: Double-Electric Breast Pump Pumping frequency: Mom is pumping every 3 hours for 15 minutes with her Personal Medela DEBP Pumped volume: 4 mL  Interventions Interventions: Expressed milk;DEBP;Education;Adjust position;Support pillows;Skin to skin;Assisted with latch  Discharge    Consult Status Consult Status: Follow-up Date: 02/11/21 Follow-up type: In-patient    Christine Rivas 02/10/2021, 7:30 PM

## 2021-02-10 NOTE — Progress Notes (Signed)
PPD # 1 S/P NSVD  Live born female  Birth Weight: 7 lb 7 oz (3374 g) APGAR: 8, 10  Newborn Delivery   Birth date/time: 02/09/2021 23:50:00 Delivery type: Vaginal, Spontaneous     Baby name: Maddux Delivering provider: MODY, VAISHALI  Episiotomy:None   Lacerations:Periurethral;1st degree;Perineal   Circumcision Yes, planning  Feeding: breast  Pain control at delivery: Epidural   S:  Reports feeling well this morning with no complaints             Tolerating PO/No nausea or vomiting             Bleeding is light             Pain controlled with acetaminophen and ibuprofen (OTC)             Up ad lib/ambulatory/voiding without difficulties   O:  A & O x 3, in no apparent distress  Vitals:   02/10/21 0100 02/10/21 0133 02/10/21 0300 02/10/21 0725  BP: 118/79 113/89 109/78 115/83  Pulse: 98 98 92 78  Resp: 16 18 18 20   Temp:  97.8 F (36.6 C) (!) 97.5 F (36.4 C) (!) 97.5 F (36.4 C)  TempSrc:  Axillary Axillary Oral  SpO2:  100% 100%   Weight:      Height:       Recent Labs    02/09/21 1734 02/10/21 0517  WBC 9.4 15.4*  HGB 11.5* 10.8*  HCT 34.9* 32.3*  PLT 262 206    Blood type: --/--/A POS (11/21 1734)  Rubella: Immune (05/17 0000)   I&O: I/O last 3 completed shifts: In: 0  Out: 450 [Urine:300; Blood:150]          No intake/output data recorded.  Vaccines: TDaP          UTD                    COVID-19 UTD   Gen: AAO x 3, NAD  Abdomen: soft, non-tender, non-distended            Fundus: firm, non-tender, U-1  Perineum: repair intact  Lochia: small  Extremities: no edema, no calf pain or tenderness   A/P:  PPD # 1 35 y.o., 33  Principal Problem:   Postpartum care following vaginal delivery 11/21  Doing well - stable status  Routine post partum orders Active Problems:   Encounter for induction of labor   SVD (spontaneous vaginal delivery)   First degree perineal laceration  Discussed perineal care and comfort measures.   Anticipate  discharge tomorrow.   12/21, MSN, CNM 02/10/2021, 10:16 AM

## 2021-02-10 NOTE — Anesthesia Postprocedure Evaluation (Signed)
Anesthesia Post Note  Patient: Christine Rivas  Procedure(s) Performed: AN AD HOC LABOR EPIDURAL     Patient location during evaluation: Mother Baby Anesthesia Type: Epidural Level of consciousness: awake and alert and oriented Pain management: satisfactory to patient Vital Signs Assessment: post-procedure vital signs reviewed and stable Respiratory status: respiratory function stable Cardiovascular status: stable Postop Assessment: no headache, no backache, epidural receding, patient able to bend at knees, no signs of nausea or vomiting, adequate PO intake and able to ambulate Anesthetic complications: no   No notable events documented.  Last Vitals:  Vitals:   02/10/21 0300 02/10/21 0725  BP: 109/78 115/83  Pulse: 92 78  Resp: 18 20  Temp: (!) 36.4 C (!) 36.4 C  SpO2: 100%     Last Pain:  Vitals:   02/10/21 0725  TempSrc: Oral  PainSc: 0-No pain   Pain Goal:                   [name]

## 2021-02-11 MED ORDER — IBUPROFEN 600 MG PO TABS
600.0000 mg | ORAL_TABLET | Freq: Four times a day (QID) | ORAL | 0 refills | Status: AC
Start: 2021-02-11 — End: ?

## 2021-02-11 MED ORDER — ACETAMINOPHEN 325 MG PO TABS: 650.0000 mg | ORAL_TABLET | ORAL | 1 refills | Status: AC | PRN

## 2021-02-11 NOTE — Lactation Note (Signed)
This note was copied from a baby's chart. Lactation Consultation Note  Patient Name: Christine Rivas Date: 02/11/2021 Reason for consult: Follow-up assessment;Term;Infant weight loss;Other (Comment) (7 % weight loss / post circ / spitty / baby awake/ LC offered to assist / great colostrum flow/ latched for few sucks / gaggy / spitty.) Age:33 hours LC reviewed BF D/C teaching / recommended to continue working on latching and LC showed dad how he could assist to obtain the depth at the breast.  Excellent flow of colostrum.  Mom aware of the [facility] resources after D/C.   Maternal Data Has patient been taught Hand Expression?: Yes  Feeding Mother's Current Feeding Choice: Breast Milk and Formula Nipple Type: Nfant Standard Flow (white)  LATCH Score Latch: Repeated attempts needed to sustain latch, nipple held in mouth throughout feeding, stimulation needed to elicit sucking reflex.  Audible Swallowing: A few with stimulation  Type of Nipple: Everted at rest and after stimulation  Comfort (Breast/Nipple): Soft / non-tender  Hold (Positioning): Assistance needed to correctly position infant at breast and maintain latch.  LATCH Score: 7   Lactation Tools Discussed/Used Tools: Pump Breast pump type: Double-Electric Breast Pump;Manual Pump Education: Milk Storage Reason for Pumping: baby not consistently latching Pumping frequency: per mom has been pumping every 3 hours / more colostrum  Interventions Interventions: Breast feeding basics reviewed;Assisted with latch;Skin to skin;Breast massage;Hand express;Adjust position;Breast compression;Support pillows;Position options;Expressed milk;DEBP;Education;LC Services brochure  Discharge Discharge Education: Outpatient recommendation;Engorgement and breast care;Warning signs for feeding baby;Other (comment) (mom aware to check with her [facility] office) Pump: Personal;Manual;DEBP  Consult Status Consult Status: Complete Date:  02/11/21    Kathrin Greathouse 02/11/2021, 1:21 PM

## 2021-02-11 NOTE — Discharge Summary (Signed)
OB Discharge Summary  Patient Name: Christine Rivas DOB: 05/17/1985 MRN: [id number]  Date of admission: 02/09/2021 Delivering provider: MODY, VAISHALI   Admitting diagnosis: Encounter for induction of labor [Z34.90] SVD (spontaneous vaginal delivery) [O80] Intrauterine pregnancy: [redacted]w[redacted]d     Secondary diagnosis: Patient Active Problem List   Diagnosis Date Noted   First degree perineal laceration 02/10/2021   Postpartum care following vaginal delivery 11/21 02/10/2021   SVD (spontaneous vaginal delivery) 12/08/2018   Encounter for induction of labor 12/07/2018    Date of discharge: 02/11/2021   Discharge diagnosis: Principal Problem:   Postpartum care following vaginal delivery 11/21 Active Problems:   Encounter for induction of labor   SVD (spontaneous vaginal delivery)   First degree perineal laceration                                                           Post partum procedures: None  Augmentation: AROM and Pitocin Pain control: Epidural  Laceration:Periurethral;1st degree;Perineal  Episiotomy:None  Complications: None  Hospital course:  Induction of Labor With Vaginal Delivery   35 y.o. yo G2P2002 at [redacted]w[redacted]d was admitted to the hospital 02/09/2021 for induction of labor.  Indication for induction: Elective.  Patient had an uncomplicated labor course as follows: Membrane Rupture Time/Date: 5:52 PM ,02/09/2021   Delivery Method:Vaginal, Spontaneous  Episiotomy: None  Lacerations:  Periurethral;1st degree;Perineal  Details of delivery can be found in separate delivery note.  Patient had a routine postpartum course. Patient is discharged home 02/11/21.  Newborn Data: Birth date:02/09/2021  Birth time:11:50 PM  Gender:Female  Living status:Living  Apgars:8 ,10  Weight:3374 g   Physical exam  Vitals:   02/10/21 1125 02/10/21 1614 02/10/21 2018 02/11/21 0605  BP: 106/73 121/86 124/88 (!) 128/99  Pulse: 92 84 93 82  Resp: 20 20 18 16   Temp: 97.7 F (36.5 C)  97.7  F (36.5 C) 97.8 F (36.6 C)  TempSrc: Oral Oral Oral Oral  SpO2:   100% 98%  Weight:      Height:       General: alert, cooperative, and no distress Lochia: appropriate Uterine Fundus: firm Incision: N/A Perineum: repair intact, no edema DVT Evaluation: No evidence of DVT seen on physical exam.  Labs: Lab Results  Component Value Date   WBC 15.4 (H) 02/10/2021   HGB 10.8 (L) 02/10/2021   HCT 32.3 (L) 02/10/2021   MCV 91.0 02/10/2021   PLT 206 02/10/2021   CMP Latest Ref Rng & Units 11/03/2020  Glucose 70 - 99 mg/dL 11/05/2020)  BUN 6 - 20 mg/dL 14  Creatinine 315(Q - 0.08 mg/dL 6.76  Sodium 1.95 - 093 mmol/L 131(L)  Potassium 3.5 - 5.1 mmol/L 3.9  Chloride 98 - 111 mmol/L 101  CO2 22 - 32 mmol/L 21(L)  Calcium 8.9 - 10.3 mg/dL 267)  Total Protein 6.5 - 8.1 g/dL 5.9(L)  Total Bilirubin 0.3 - 1.2 mg/dL 0.4  Alkaline Phos 38 - 126 U/L 50  AST 15 - 41 U/L 16  ALT 0 - 44 U/L 12   Edinburgh Postnatal Depression Scale Screening Tool 02/11/2021 12/09/2018 12/08/2018  I have been able to laugh and see the funny side of things. 0 0 (No Data)  I have looked forward with enjoyment to things. 0 0 -  I have blamed myself  unnecessarily when things went wrong. 1 2 -  I have been anxious or worried for no good reason. 2 1 -  I have felt scared or panicky for no good reason. 1 1 -  Things have been getting on top of me. 1 1 -  I have been so unhappy that I have had difficulty sleeping. 0 0 -  I have felt sad or miserable. 0 0 -  I have been so unhappy that I have been crying. 0 0 -  The thought of harming myself has occurred to me. 0 0 -  Edinburgh Postnatal Depression Scale Total 5 5 -   Discharge instructions:  per After Visit Summary  After Visit Meds:  Allergies as of 02/11/2021   No Known Allergies      Medication List     TAKE these medications    acetaminophen 325 MG tablet Commonly known as: Tylenol Take 2 tablets (650 mg total) by mouth every 4 (four) hours as  needed (for pain scale < 4).   famotidine 20 MG tablet Commonly known as: PEPCID Take 1 tablet (20 mg total) by mouth 2 (two) times daily.   ferrous sulfate 325 (65 FE) MG tablet Take 325 mg by mouth every other day.   ibuprofen 600 MG tablet Commonly known as: ADVIL Take 1 tablet (600 mg total) by mouth every 6 (six) hours.   multivitamin tablet Take 1 tablet by mouth daily. Prenatal one-a-day       Diet: routine diet  Activity: Advance as tolerated. Pelvic rest for 6 weeks.   Newborn Data: Live born female  Birth Weight: 7 lb 7 oz (3374 g) APGAR: 8, 10  Newborn Delivery   Birth date/time: 02/09/2021 23:50:00 Delivery type: Vaginal, Spontaneous     Named Maddux Baby Feeding: Breast Disposition:home with mother  Delivery Report:  Review the Delivery Report for details.    Follow up:  Follow-up Information     Shea Evans, MD. Schedule an appointment as soon as possible for a visit in 6 week(s).   Specialty: Obstetrics and Gynecology Contact information: [address] [place] [address] [phone]                Clancy Gourd, MSN 02/11/2021, 12:35 PM

## 2021-02-19 IMAGING — US US RENAL
1 series · 15 of 25 positions shown · non-contrast
Comparison: None.

CLINICAL DATA: Initial evaluation for acute right flank pain, 27
weeks pregnant.

EXAM:
RENAL / URINARY TRACT ULTRASOUND COMPLETE

[Series 1: us renal · 15 of 49 slices shown]
[im 1/49]
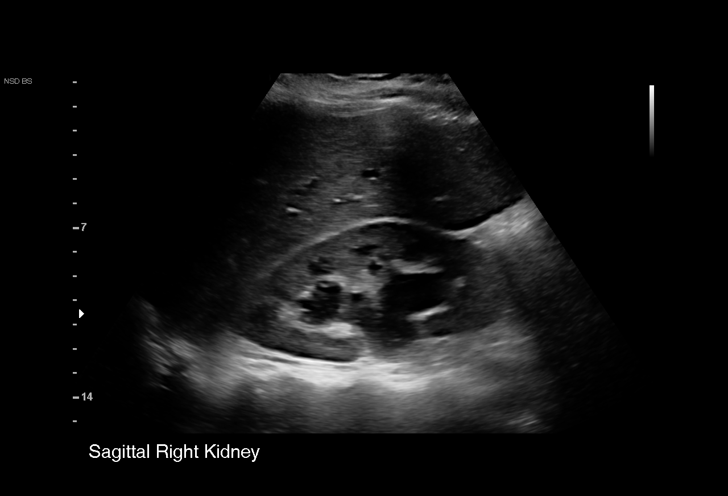
[im 5/49]
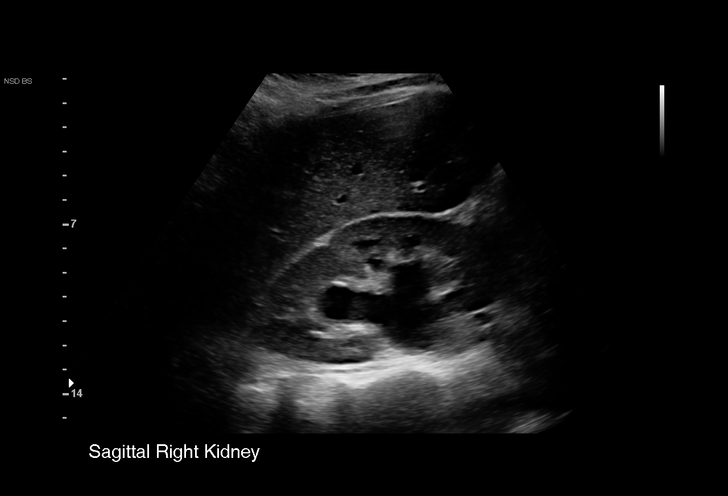
[im 9/49]
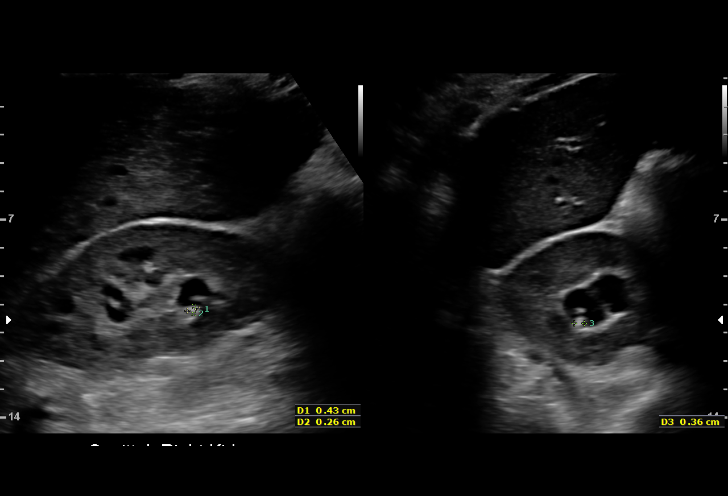
[im 11/49]
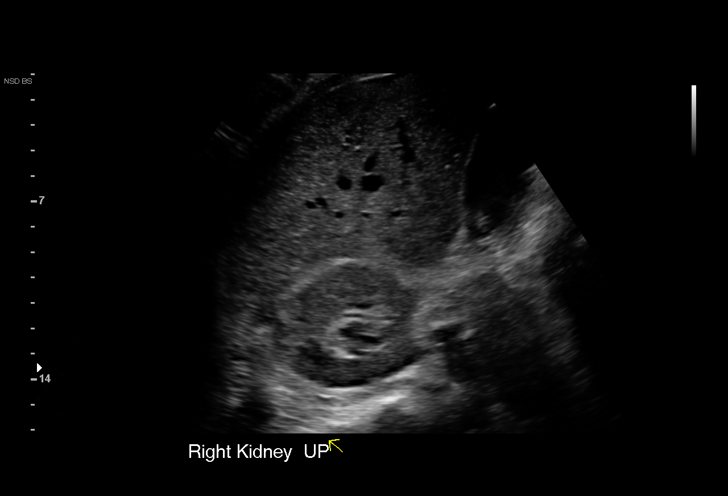
[im 15/49]
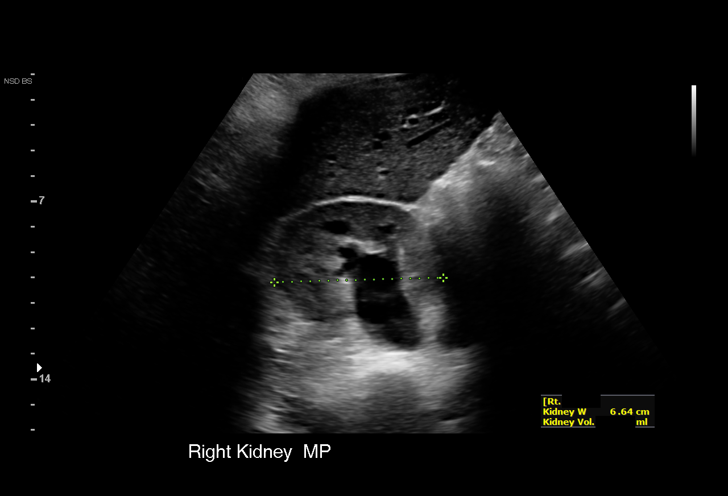
[im 19/49]
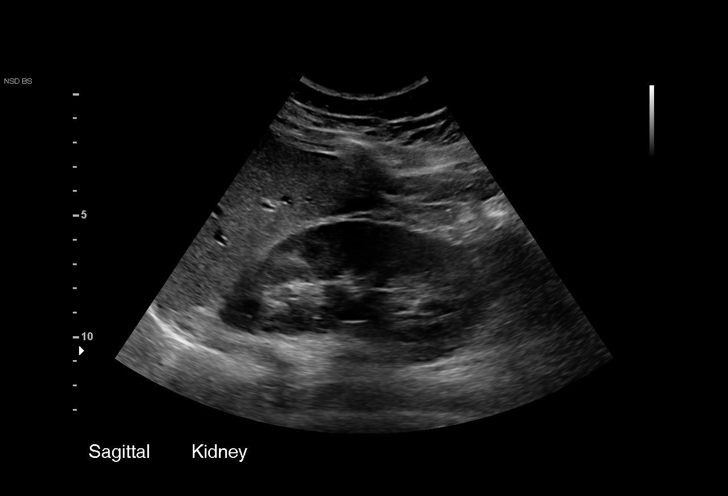
[im 21/49]
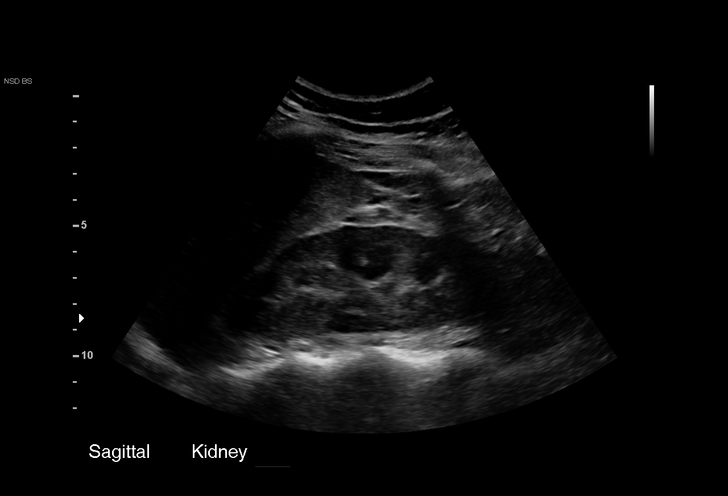
[im 25/49]
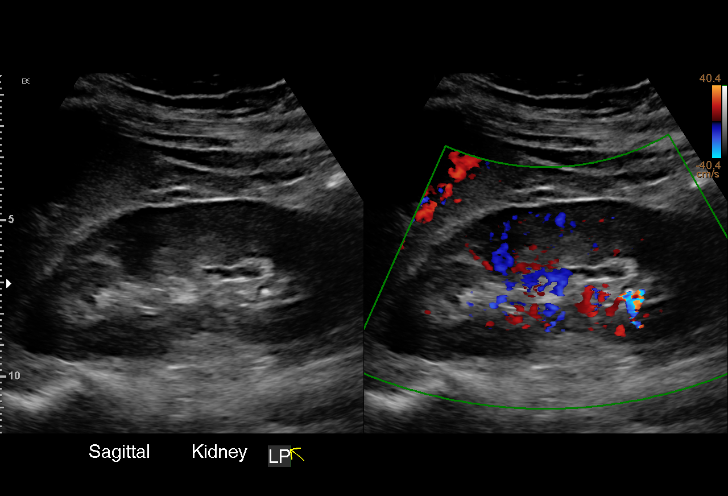
[im 29/49]
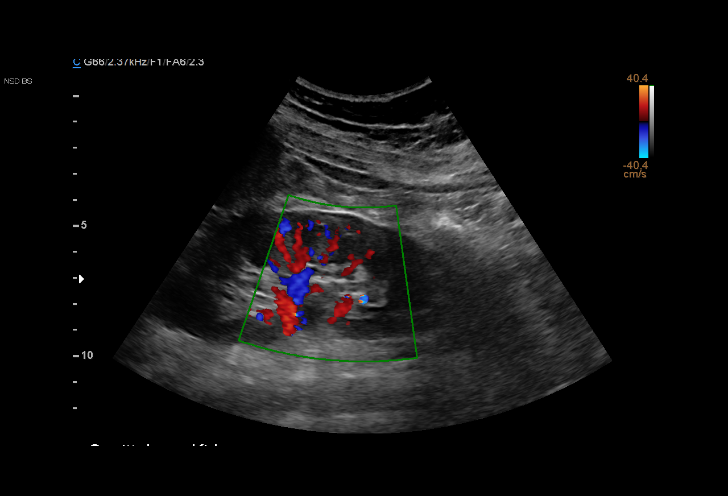
[im 31/49]
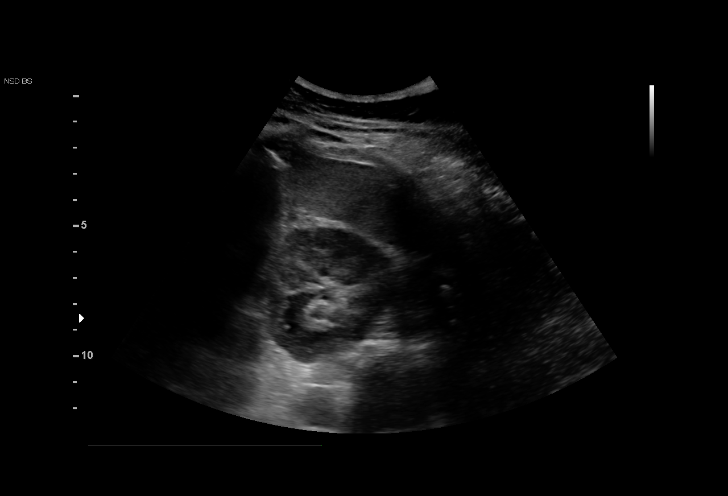
[im 35/49]
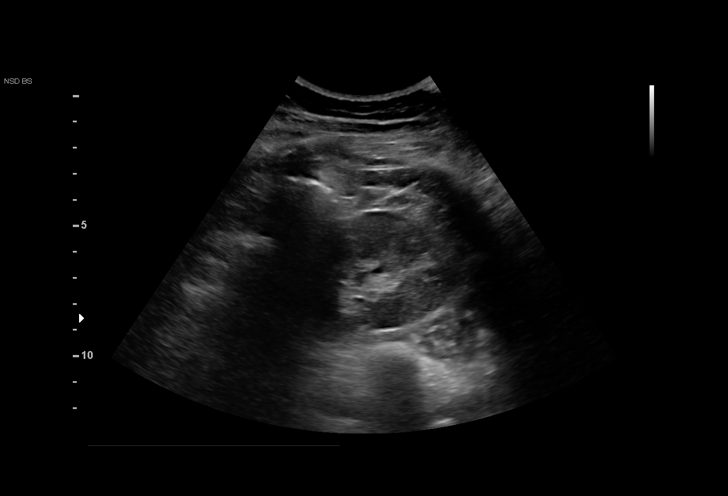
[im 39/49]
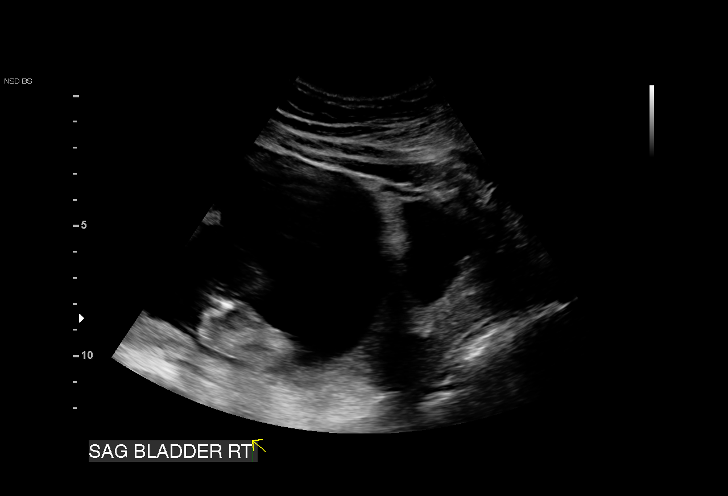
[im 41/49]
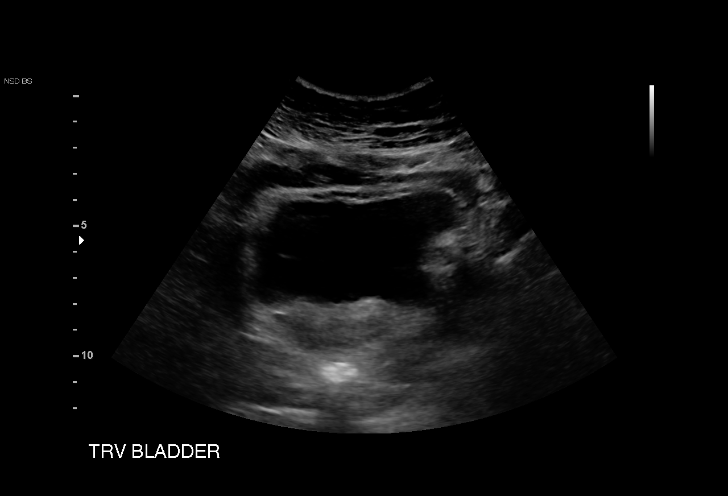
[im 45/49]
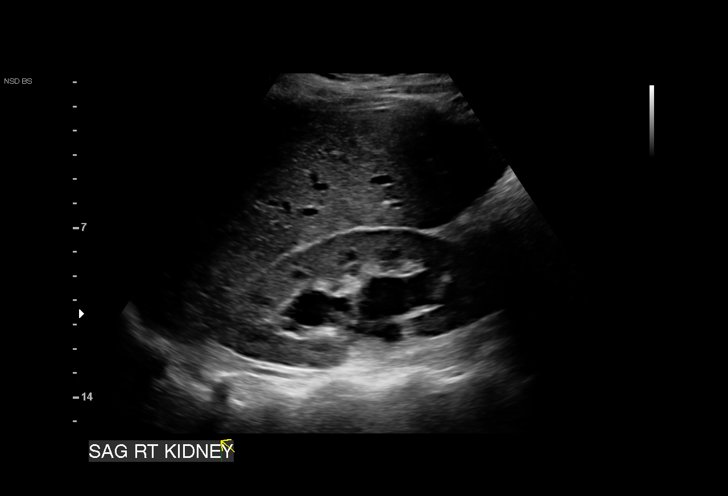
[im 49/49]
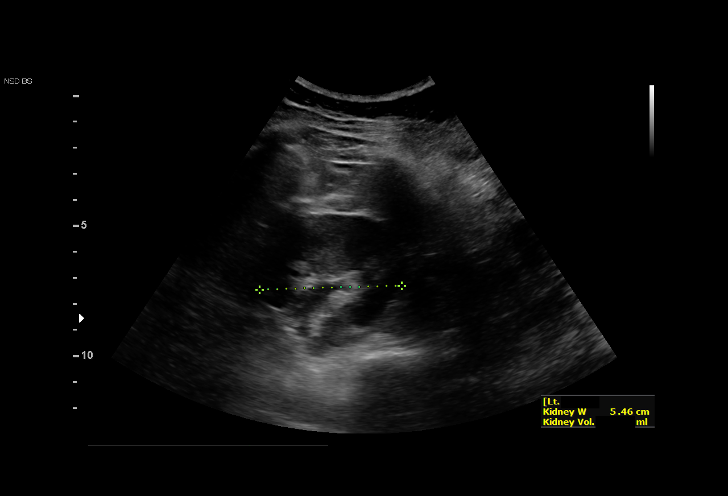

[15 of 25 positions shown; findings below may reference images not displayed]

FINDINGS: Right Kidney:

Renal measurements: 11.4 x 5.6 x 6.6 cm = volume: 222.9 mL.
Echogenicity within normal limits. 4 mm nonobstructive stone present
at the lower pole. Moderate right hydronephrosis, which persisted
postvoid. No discrete renal lesion.

Left Kidney:

Renal measurements: 11.2 x 5.0 x 5.5 cm = volume: 160.1 mL.
Echogenicity within normal limits. No discrete renal lesion. 5 mm
nonobstructive calculus at the lower pole. No hydronephrosis.

Bladder:

Appears normal for degree of bladder distention. A left ureteral jet
was visualized. No right ureteral jet visualized.
IMPRESSION: 1. Moderate right hydronephrosis.
2. Bilateral nonobstructive nephrolithiasis as above.

## 2021-02-21 ENCOUNTER — Telehealth (HOSPITAL_COMMUNITY): Payer: Self-pay | Admitting: *Deleted

## 2021-02-21 NOTE — Telephone Encounter (Signed)
Attempted Hospital Discharge Follow-Up Call.  Left voice mail requesting that patient return RN's phone call.  

## 2023-03-26 IMAGING — US US RENAL
1 series · 15 of 25 positions shown · non-contrast
Comparison: Renal ultrasound 01/29/2019.

CLINICAL DATA: 34-year-old female is pregnant in the 2nd trimester
with left side flank pain. History of kidney stones.

EXAM:
RENAL / URINARY TRACT ULTRASOUND COMPLETE

[Series 1: us renal · 15 of 32 slices shown]
[im 1/32]
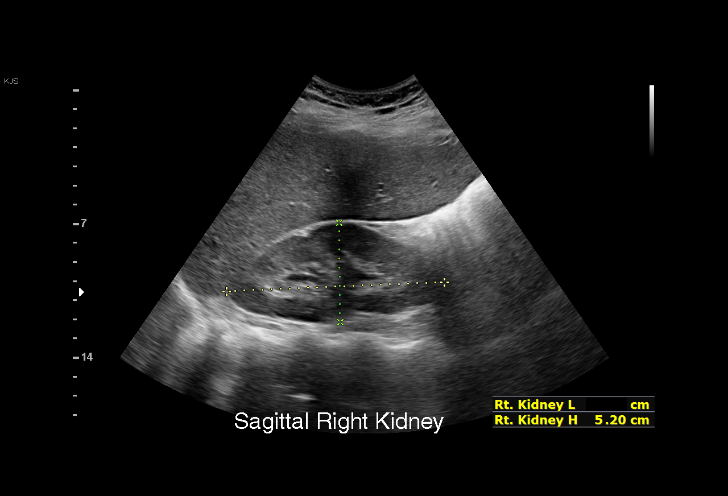
[im 3/32]
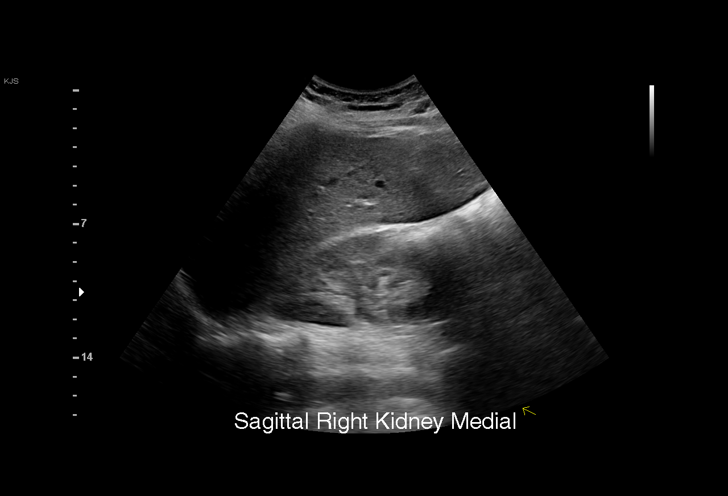
[im 6/32]
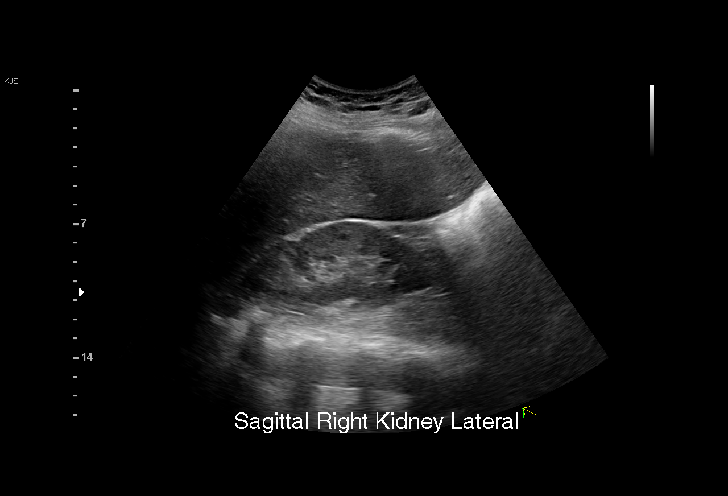
[im 7/32]
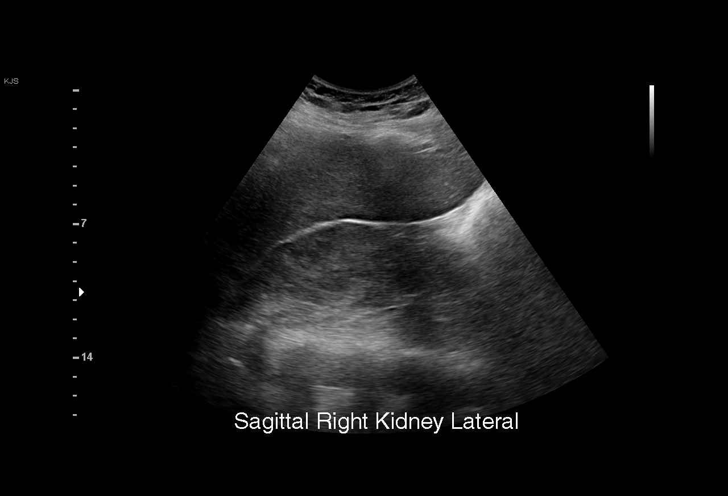
[im 10/32]
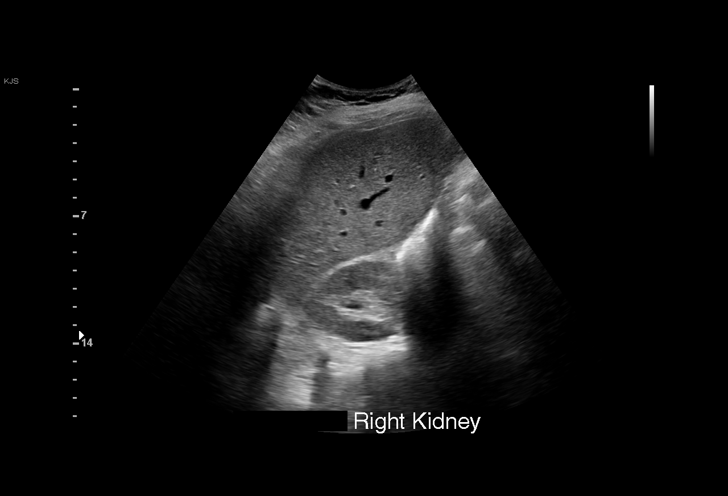
[im 12/32]
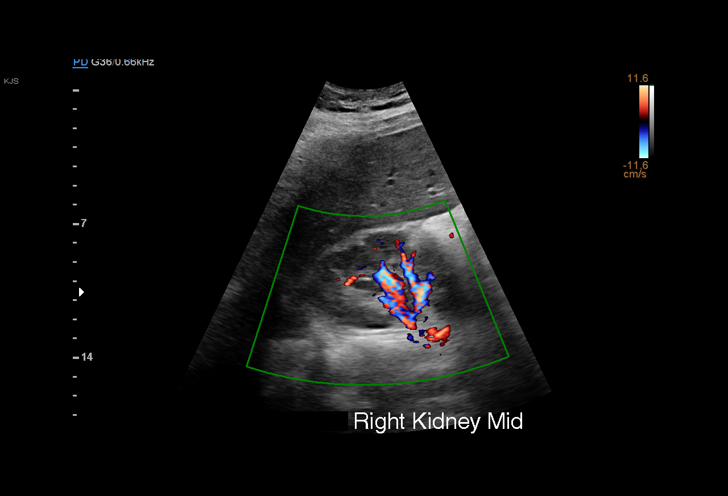
[im 13/32]
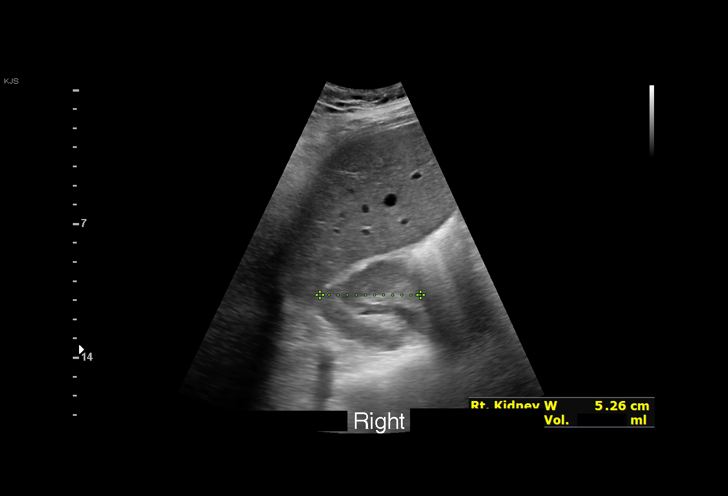
[im 16/32]
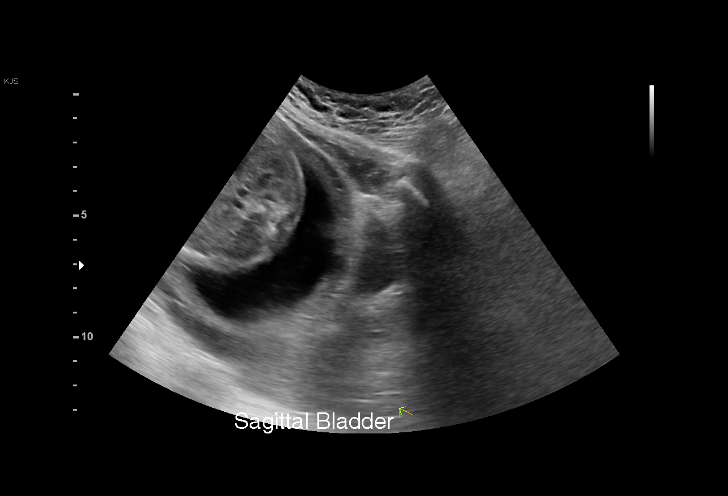
[im 19/32]
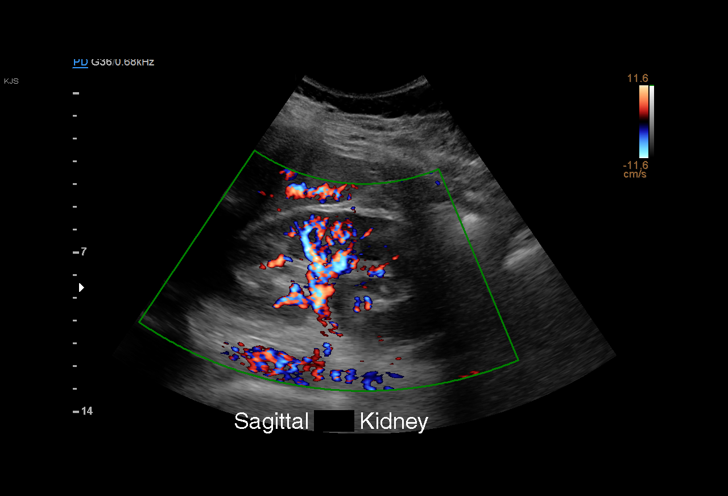
[im 20/32]
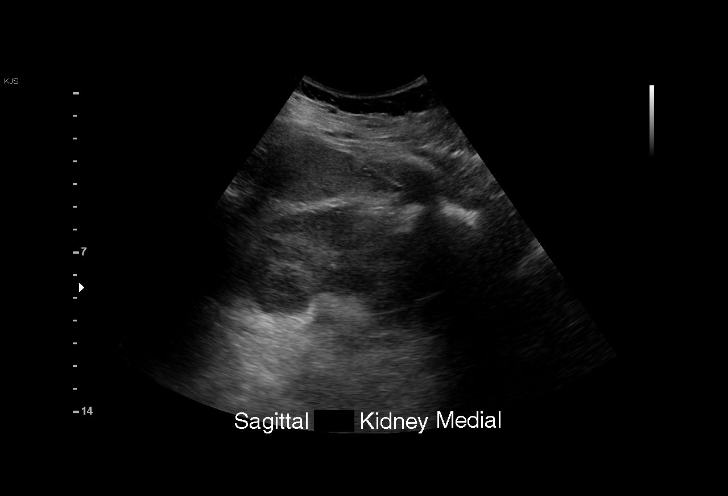
[im 22/32]
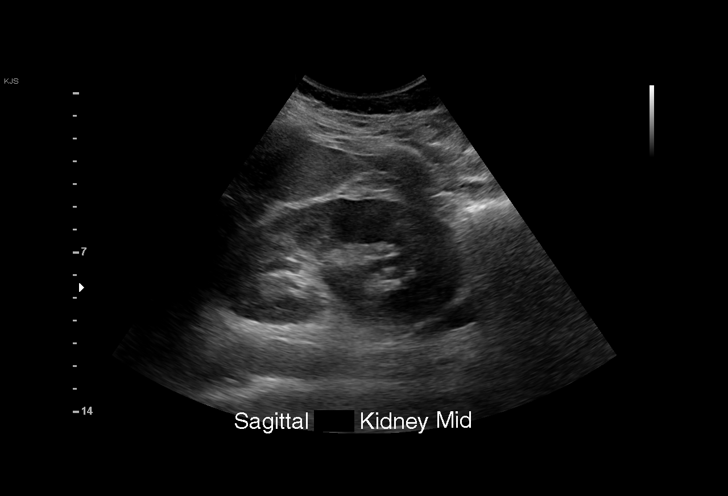
[im 25/32]
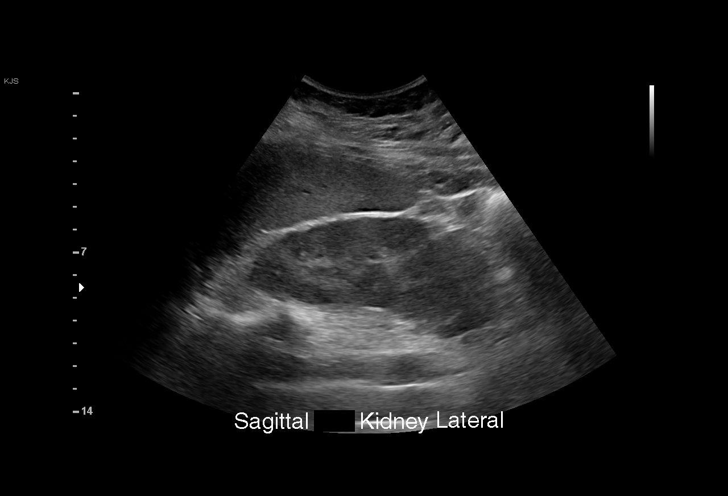
[im 26/32]
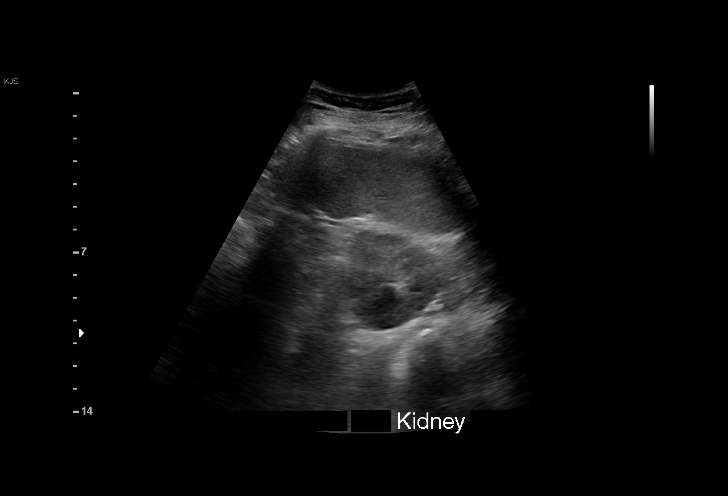
[im 29/32]
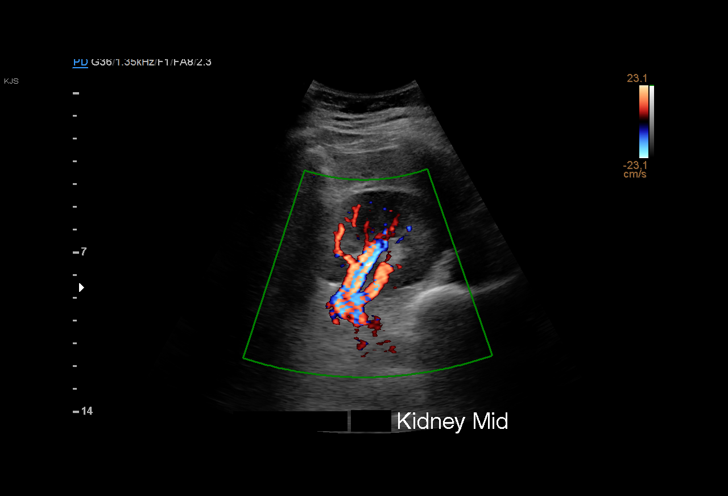
[im 32/32]
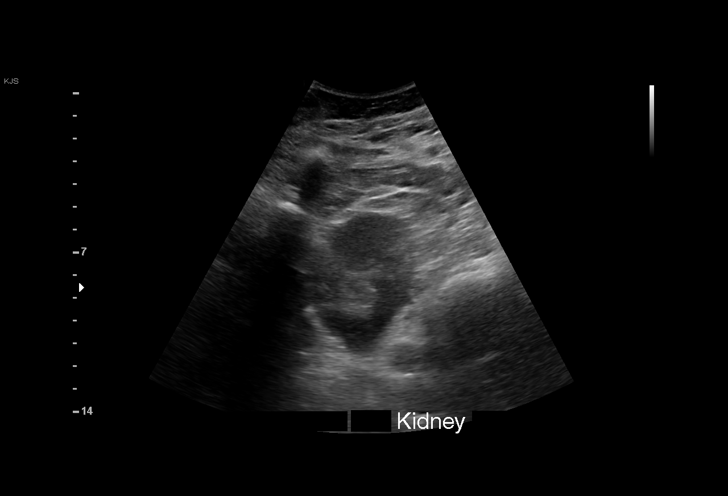

[15 of 25 positions shown; findings below may reference images not displayed]

FINDINGS: Right Kidney:

Renal measurements: 11.4 x 5.2 x 5.3 cm = volume: 163 mL.
Echogenicity within normal limits. Mild right hydronephrosis when
compared to the 4848 ultrasound (series 1, image 6 today versus
series 2, image 7 at that time). No right renal mass. No calculi
evident by ultrasound today.

Left Kidney:

Renal measurements: 11.0 x 5.4 x 5.4 cm = volume: 168 mL.
Echogenicity within normal limits. Similar borderline to mild left
hydronephrosis (image 24). No left renal cyst or mass. No calculi
evident by ultrasound today.

Bladder:

Diminutive, otherwise unremarkable.  Fetal head visible.

Other:

None.
IMPRESSION: Symmetric mild bilateral hydronephrosis when compared to a 4848
ultrasound. Favor maternal hydronephrosis of pregnancy.

## 2023-08-17 ENCOUNTER — Encounter: Payer: Self-pay | Admitting: Physician Assistant
